# Patient Record
Sex: Female | Born: 1990 | Hispanic: No | Marital: Single | Smoking: Never smoker
Health system: Southern US, Community
[De-identification: ages and names within clinical notes are randomized; demographics above are authoritative.]

## PROBLEM LIST (undated history)

## (undated) DIAGNOSIS — J45909 Unspecified asthma, uncomplicated: Secondary | ICD-10-CM

## (undated) DIAGNOSIS — J3501 Chronic tonsillitis: Secondary | ICD-10-CM

## (undated) DIAGNOSIS — F909 Attention-deficit hyperactivity disorder, unspecified type: Secondary | ICD-10-CM

## (undated) DIAGNOSIS — D509 Iron deficiency anemia, unspecified: Secondary | ICD-10-CM

## (undated) DIAGNOSIS — M549 Dorsalgia, unspecified: Secondary | ICD-10-CM

## (undated) HISTORY — DX: Chronic tonsillitis: J35.01

## (undated) HISTORY — DX: Unspecified asthma, uncomplicated: J45.909

## (undated) HISTORY — DX: Iron deficiency anemia, unspecified: D50.9

## (undated) HISTORY — DX: Dorsalgia, unspecified: M54.9

## (undated) HISTORY — PX: OTHER SURGICAL HISTORY: SHX169

## (undated) HISTORY — DX: Attention-deficit hyperactivity disorder, unspecified type: F90.9

---
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
## 2009-03-26 ENCOUNTER — Encounter: Payer: Self-pay | Admitting: Internal Medicine

## 2009-06-12 ENCOUNTER — Encounter (INDEPENDENT_AMBULATORY_CARE_PROVIDER_SITE_OTHER): Payer: Self-pay | Admitting: *Deleted

## 2009-06-12 ENCOUNTER — Ambulatory Visit: Payer: Self-pay | Admitting: Internal Medicine

## 2009-06-12 DIAGNOSIS — J3501 Chronic tonsillitis: Secondary | ICD-10-CM | POA: Insufficient documentation

## 2009-06-12 DIAGNOSIS — M549 Dorsalgia, unspecified: Secondary | ICD-10-CM

## 2009-06-12 HISTORY — DX: Dorsalgia, unspecified: M54.9

## 2009-06-12 HISTORY — DX: Chronic tonsillitis: J35.01

## 2009-06-16 ENCOUNTER — Telehealth (INDEPENDENT_AMBULATORY_CARE_PROVIDER_SITE_OTHER): Payer: Self-pay | Admitting: *Deleted

## 2009-06-18 ENCOUNTER — Ambulatory Visit: Payer: Self-pay | Admitting: Internal Medicine

## 2009-07-08 ENCOUNTER — Ambulatory Visit (HOSPITAL_COMMUNITY): Admission: RE | Admit: 2009-07-08 | Discharge: 2009-07-08 | Payer: Self-pay | Admitting: Internal Medicine

## 2009-07-08 ENCOUNTER — Ambulatory Visit: Payer: Self-pay

## 2009-07-08 ENCOUNTER — Ambulatory Visit: Payer: Self-pay | Admitting: Cardiology

## 2009-07-10 ENCOUNTER — Encounter (INDEPENDENT_AMBULATORY_CARE_PROVIDER_SITE_OTHER): Payer: Self-pay | Admitting: *Deleted

## 2009-07-11 ENCOUNTER — Telehealth: Payer: Self-pay | Admitting: Internal Medicine

## 2009-07-14 ENCOUNTER — Encounter (INDEPENDENT_AMBULATORY_CARE_PROVIDER_SITE_OTHER): Payer: Self-pay | Admitting: *Deleted

## 2010-04-28 ENCOUNTER — Ambulatory Visit: Admit: 2010-04-28 | Payer: Self-pay | Admitting: Internal Medicine

## 2010-05-03 LAB — CONVERTED CEMR LAB
AST: 20 units/L (ref 0–37)
Albumin: 3.9 g/dL (ref 3.5–5.2)
Alkaline Phosphatase: 57 units/L (ref 39–117)
BUN: 14 mg/dL (ref 6–23)
Basophils Absolute: 0 10*3/uL (ref 0.0–0.1)
Basophils Relative: 0.1 % (ref 0.0–3.0)
CO2: 28 meq/L (ref 19–32)
Calcium: 8.9 mg/dL (ref 8.4–10.5)
Chloride: 108 meq/L (ref 96–112)
Creatinine, Ser: 0.7 mg/dL (ref 0.4–1.2)
Eosinophils Relative: 1.2 % (ref 0.0–5.0)
Folate: 8.8 ng/mL
GFR calc non Af Amer: 138.07 mL/min (ref >60)
HCT: 34.7 % — ABNORMAL LOW (ref 36.0–46.0)
Hemoglobin: 11.6 g/dL — ABNORMAL LOW (ref 12.0–15.0)
Monocytes Absolute: 0.9 10*3/uL (ref 0.1–1.0)
Monocytes Relative: 9.2 % (ref 3.0–12.0)
Neutro Abs: 7.2 10*3/uL (ref 1.4–7.7)
RBC: 3.84 M/uL — ABNORMAL LOW (ref 3.87–5.11)
RDW: 13 % (ref 11.5–14.6)
Sodium: 139 meq/L (ref 135–145)
TSH: 1.53 microintl units/mL (ref 0.35–5.50)
Transferrin: 306.8 mg/dL (ref 212.0–360.0)
WBC: 9.7 10*3/uL (ref 4.5–10.5)

## 2010-05-07 NOTE — Letter (Signed)
Summary: Out of [facility]  [address] [place], [place] [address]   Phone: [phone]  Fax: [phone]    June 12, 2009   Student:  AUGUSTA MIRKIN    To Whom It May Concern:   For Medical reasons, please excuse the above named student from school for the following dates:  Start:   June 10, 2009  End:    June 16, 2009  If you need additional information, please feel free to contact our office.   Sincerely,    Dr. Oliver Barre    ****This is a legal document and cannot be tampered with.  Schools are authorized to verify all information and to do so accordingly.

## 2010-05-07 NOTE — Letter (Signed)
Summary: Generic Letter  [facility]  [address] [place], [place] [address]   Phone: [phone]  Fax: [phone]    07/14/2009  Victoria Wade [address] [place], [place]  [address]  Dear Ms. [name],   Our office has been trying to contact you with the results of the ECHO you had done per your request. Dr.John would like you to know that everything was:   OK except for ? of PFO  - (patent foramen ovale); I asked Dr Graciela Husbands, but he feels no further eval or treatment is needed at this time.  If you have any further questions or concerns please feel free to contact our office at the number listed above. Thank you.          Sincerely,   Margaret Pyle, CMA  Appended Document: Generic Letter pt's mother informed

## 2010-05-07 NOTE — Letter (Signed)
Summary: Work Writer, Main Office  [address]   [place], [place] [address]   Phone: [phone]  Fax: [phone]     July 10, 2009    [facility]   The above named patient had a medical visit on 07/08/09 at: 2:00 pm.  Please take this into consideration when reviewing the time away from school.      Sincerely yours,  Sherri Rad- RN [facility]
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
## 2010-05-07 NOTE — Letter (Signed)
Summary: Out of [facility]  [address] [place], [place] [address]   Phone: [phone]  Fax: [phone]    June 12, 2009   Student:  KHANIYA TENAGLIA    To Whom It May Concern:   For Medical reasons, please excuse the above named student from school for the following dates:  Start:   June 12, 2009  End:     If you need additional information, please feel free to contact our office.   Sincerely,    Dr. Oliver Barre    ****This is a legal document and cannot be tampered with.  Schools are authorized to verify all information and to do so accordingly.

## 2010-05-07 NOTE — Progress Notes (Signed)
----   Converted from flag ---- ---- 06/13/2009 12:43 PM, Edman Circle wrote: appt for echo 3/15@ 7:30 & 3/16 @ 4:00 with Graciela Husbands  ---- 06/13/2009 8:12 AM, Dagoberto Reef wrote: Thanks  ---- 06/13/2009 7:38 AM, Corwin Levins MD wrote: The following orders have been entered for this patient and placed on Admin Hold:  Type:     Referral       Code:   Echo Description:   Echo Referral Order Date:   06/12/2009   Authorized By:   Corwin Levins MD Order #:   [id number] Clinical Notes:   Type:  Special instructions: ------------------------------

## 2010-05-07 NOTE — Progress Notes (Signed)
Summary: ECHO  Phone Note Call from Patient   Caller: Patient [phone] Summary of Call: pt called requesting results of ECHO, please advise. Initial call taken by: Margaret Pyle, CMA,  July 11, 2009 3:32 PM  Follow-up for Phone Call        on phone tree - OK except for ? of PFO  - (patent foramen ovale); I asked Dr Graciela Husbands, but he feels no further eval or treatment is needed at this time Follow-up by: Corwin Levins MD,  July 11, 2009 3:59 PM  Additional Follow-up for Phone Call Additional follow up Details #1::        pt phone number disconnected since 04/08. I will send pt a letter and close phone note Additional Follow-up by: Margaret Pyle, CMA,  July 14, 2009 1:46 PM

## 2010-05-07 NOTE — Assessment & Plan Note (Signed)
Summary: nep/syncope/jml   Visit Type:  Initial Consult  CC:  New EP evaluation- Syncopal episodes.  History of Present Illness: Victoria Wade is seen at the request of Dr. Jonny Ruiz because of recurrent syncope.  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 She does not recall any symptoms prior to about 2 years ago. She is also problems with shower intolerance and heat intolerance. Her symptoms are characterized by lightheadedness a vertiginous sensation diaphoresis some flushing but no nausea. There is residual fatigue.  She also has a shower intolerance. Her diet sounds relatively salt replete and fluid replete.  In the past she is thought to be iron deficient and she was treated with iron supplementation. Recent laboratories were reviewed with a normal MCV and hemoglobin of about 11.5  Current Medications (verified): 1)  Ventolin Hfa 108 (90 Base) Mcg/act Aers (Albuterol Sulfate) .... 2 Puffs As Needed 2)  Azithromycin 250 Mg Tabs (Azithromycin) .... 2po Qd For 1 Day, Then 1po Qd For 4days, Then Stop 3)  Naproxen 500 Mg Tabs (Naproxen) .Marland Kitchen.. 1 By Mouth Two Times A Day As Needed Pain 4)  Excedrin Extra Strength 250-250-65 Mg Tabs (Aspirin-Acetaminophen-Caffeine) .... As Needed 5)  Excedrin Menstrual Complete 250-250-65 Mg Tabs (Aspirin-Acetaminophen-Caffeine) .... As Needed 6)  Womens One Daily  Tabs (Multiple Vitamins-Minerals) .... Take 1 Tablet By Mouth Once A Day 7)  Fish Oil 1000 Mg Caps (Omega-3 Fatty Acids) .... Take 1 Capsule By Mouth Once A Day 8)  Iron 325 (65 Fe) Mg Tabs (Ferrous Sulfate) .... Take 1 Tablet By Mouth Once A Day  Allergies (verified): No Known Drug Allergies  Past History:  Past Medical History: Last updated: 10/30/2008 Asthma ADHD Anemia-iron deficiency  Vital Signs:  Patient profile:   20 year old female Height:      65 inches Weight:       149.75 pounds BMI:     25.01 Pulse (ortho):   77 / minute Pulse rhythm:   regular Resp:     18 per minute BP standing:   104 / 65  Vitals Entered By: Vikki Ports (June 18, 2009 3:37 PM)  Serial Vital Signs/Assessments:  Time      Position  BP       Pulse  Resp  Temp     By 3:44 PM   Lying RA  115/72   94                    Luz Jaramillo 3:46 PM   Sitting   107/65   85                    Luz Jaramillo 3:46 PM   Standing  104/65   77                    Luz Jaramillo 3:48 PM   Standing  111/70   90                    Vikki Ports 3:51 PM   Standing  109/73   96                    Vikki Ports   Physical Exam  General:  Alert and oriented young African American female appearing her stated age in no acute distress. HEENT  normal . Neck veins were  flat; carotids brisk and full without bruits. No lymphadenopathy. Back without kyphosis. Lungs clear. Heart sounds regular without murmurs or gallops. PMI nondisplaced. Abdomen soft with active bowel sounds without midline pulsation or hepatomegaly. Femoral pulses and distal pulses intact. Extremities were without clubbing cyanosis or edemaSkin warm and dry. 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 has symptoms consistent with neurocardiogenic/vasovagal syncope. The state back about 2 years. There is no specific distress at was easily identified although when I raised the issue of stress her mother's head nodded incessantly. The patient gave herself also a knowledge to some things including the fact that her father was not present her senior year.  They're encouraged to address this.  Or Monday May, she is encouraged to increase her salt and water intake. I advised her to raise the head of her bed 6 inches. 2 she is also advised to avoid hot showers and be careful about ambient  temperatures.  laboratories were reviewed demonstrating  normal hemoglobin and TSH The following medications were removed from the medication list:    Prednisone 10 Mg Tabs (Prednisone) .Marland Kitchen... 3po qd for 3days, then 2po qd for 3days, then 1po qd for 3days, then stop

## 2011-02-09 ENCOUNTER — Encounter: Payer: Self-pay | Admitting: Internal Medicine

## 2011-02-09 ENCOUNTER — Telehealth: Payer: Self-pay

## 2011-02-09 NOTE — Telephone Encounter (Signed)
[facility] Triage Call Report Triage Record Num: [id number] Operator: Tomasita Crumble Patient Name: Victoria Wade Call Date & Time: 02/06/2011 4:00:37PM Patient Phone: [phone] PCP: Oliver Barre Patient Gender: Female PCP Fax : [phone] Patient DOB: 1990/05/09 Practice Name: [facility] Reason for Call: Pt. calling. LMP 01/28/11. Pt. calling. Reports headache; FSBG 188. Headache onset "for a couple of weeks now". Very tired, headaches daily dfor approx 5-6 weeks. Concerned that FSGF would be high or elevated and had family member check it. Pain rated at 6/10. Protocol(s) Used: Headache Recommended Outcome per Protocol: See Provider within 24 hours Override Outcome if Used in Protocol: Call Provider When Office is Open RN Reason for Override Outcome: Office Is Closed. Reason for Outcome: Constant headache AND unrelieved with nonprescription medications Care Advice: ~ Do not drink alcoholic beverages or use tobacco products. Most adults need to drink 6-10 eight-ounce glasses (1.2-2.0 liters) of fluids per day unless previously told to limit fluid intake for other medical reasons. Limit fluids that contain caffeine, sugar or alcohol. Urine will be a very light yellow color when you drink enough fluids. ~ Analgesic/Antipyretic Advice - Acetaminophen: Consider acetaminophen as directed on label or by pharmacist/provider for pain or fever PRECAUTIONS: - Use if there is no history of liver disease, alcoholism, or intake of three or more alcohol drinks per day - Only if approved by provider during pregnancy or when breastfeeding - During pregnancy, acetaminophen should not be taken more than 3 consecutive days without telling provider - Do not exceed recommended dose or frequency ~ 02/06/2011 4:12:06PM Page 1 of 1 [id number]_[email or web address]

## 2011-02-09 NOTE — Telephone Encounter (Signed)
To nancy - needs OV, any MD

## 2011-02-11 ENCOUNTER — Other Ambulatory Visit (INDEPENDENT_AMBULATORY_CARE_PROVIDER_SITE_OTHER): Payer: Managed Care, Other (non HMO)

## 2011-02-11 ENCOUNTER — Encounter: Payer: Self-pay | Admitting: Internal Medicine

## 2011-02-11 ENCOUNTER — Ambulatory Visit (INDEPENDENT_AMBULATORY_CARE_PROVIDER_SITE_OTHER): Payer: Managed Care, Other (non HMO) | Admitting: Internal Medicine

## 2011-02-11 VITALS — BP 102/68 | HR 92 | Temp 97.7°F | Ht 65.0 in | Wt 169.2 lb

## 2011-02-11 DIAGNOSIS — Z0001 Encounter for general adult medical examination with abnormal findings: Secondary | ICD-10-CM | POA: Insufficient documentation

## 2011-02-11 DIAGNOSIS — R7302 Impaired glucose tolerance (oral): Secondary | ICD-10-CM

## 2011-02-11 DIAGNOSIS — R7309 Other abnormal glucose: Secondary | ICD-10-CM

## 2011-02-11 DIAGNOSIS — Z0000 Encounter for general adult medical examination without abnormal findings: Secondary | ICD-10-CM

## 2011-02-11 DIAGNOSIS — J45909 Unspecified asthma, uncomplicated: Secondary | ICD-10-CM

## 2011-02-11 DIAGNOSIS — G43909 Migraine, unspecified, not intractable, without status migrainosus: Secondary | ICD-10-CM

## 2011-02-11 LAB — HEPATIC FUNCTION PANEL
ALT: 23 U/L (ref 0–35)
AST: 22 U/L (ref 0–37)
Alkaline Phosphatase: 49 U/L (ref 39–117)
Total Bilirubin: 0.4 mg/dL (ref 0.3–1.2)
Total Protein: 7.2 g/dL (ref 6.0–8.3)

## 2011-02-11 LAB — CBC WITH DIFFERENTIAL/PLATELET
Basophils Absolute: 0 10*3/uL (ref 0.0–0.1)
Basophils Relative: 0.5 % (ref 0.0–3.0)
HCT: 36.5 % (ref 36.0–46.0)
Hemoglobin: 12.6 g/dL (ref 12.0–15.0)
Lymphocytes Relative: 28.2 % (ref 12.0–46.0)
Lymphs Abs: 1.8 10*3/uL (ref 0.7–4.0)
Neutro Abs: 3.9 10*3/uL (ref 1.4–7.7)
RBC: 4.06 Mil/uL (ref 3.87–5.11)

## 2011-02-11 LAB — BASIC METABOLIC PANEL
BUN: 12 mg/dL (ref 6–23)
CO2: 27 mEq/L (ref 19–32)
Calcium: 8.9 mg/dL (ref 8.4–10.5)
Chloride: 106 mEq/L (ref 96–112)
GFR: 156.19 mL/min (ref >60.00)
Glucose, Bld: 88 mg/dL (ref 70–99)
Potassium: 4.1 mEq/L (ref 3.5–5.1)

## 2011-02-11 LAB — URINALYSIS, ROUTINE W REFLEX MICROSCOPIC
Bilirubin Urine: NEGATIVE
Hgb urine dipstick: NEGATIVE
Ketones, ur: NEGATIVE
Urine Glucose: NEGATIVE

## 2011-02-11 LAB — LIPID PANEL
Cholesterol: 140 mg/dL (ref 0–200)
HDL: 41.2 mg/dL (ref >39.00)
Total CHOL/HDL Ratio: 3

## 2011-02-11 LAB — TSH: TSH: 1.49 u[IU]/mL (ref 0.35–5.50)

## 2011-02-11 LAB — HEMOGLOBIN A1C: Hgb A1c MFr Bld: 5.5 % (ref 4.6–6.5)

## 2011-02-11 MED ORDER — ALBUTEROL SULFATE HFA 108 (90 BASE) MCG/ACT IN AERS: 2.0000 | INHALATION_SPRAY | Freq: Four times a day (QID) | RESPIRATORY_TRACT | Status: DC | PRN

## 2011-02-11 NOTE — Patient Instructions (Addendum)
You can take Excedrin Migraine for the headaches as you need Continue all other medications as before - the inhaler Please go to LAB in the Basement for the blood and/or urine tests to be done today Please call the phone number [phone] (the PhoneTree System) for results of testing in 2-3 days;  When calling, simply dial the number, and when prompted enter the MRN number above (the Medical Record Number) and the # key, then the message should start.

## 2011-02-13 ENCOUNTER — Encounter: Payer: Self-pay | Admitting: Internal Medicine

## 2011-02-13 NOTE — Assessment & Plan Note (Signed)

## 2011-02-13 NOTE — Assessment & Plan Note (Signed)
stable overall by hx and exam, most recent data reviewed with pt, and pt to continue medical treatment as before 

## 2011-02-13 NOTE — Assessment & Plan Note (Signed)
stable overall by hx and exam, and pt to continue medical treatment as before 

## 2011-02-13 NOTE — Progress Notes (Signed)
Subjective:    Patient ID: Victoria Wade, female    DOB: 04-23-1990, 20 y.o.   MRN: [id number]  HPI Here for wellness and f/u;  Overall doing ok;  Pt denies CP, worsening SOB, DOE, wheezing, orthopnea, PND, worsening LE edema, palpitations, dizziness or syncope.  Pt denies neurological change such as new Headache, facial or extremity weakness.  Pt denies polydipsia, polyuria, or low sugar symptoms. Pt states overall good compliance with treatment and medications, good tolerability, and trying to follow lower cholesterol diet.  Pt denies worsening depressive symptoms, suicidal ideation or panic. No fever, wt loss, night sweats, loss of appetite, or other constitutional symptoms.  Pt states good ability with ADL's, low fall risk, home safety reviewed and adequate, no significant changes in hearing or vision, and occasionally active with exercise.  Has occasional migraine but none worening in freq or severity lately. Did have a blood sugar per uncles glucometer at 188 - ? Error Past Medical History  Diagnosis Date  . ADHD 10/30/2008  . ANEMIA-IRON DEFICIENCY 10/30/2008  . ASTHMA 10/30/2008  . BACK PAIN 06/12/2009  . TONSILLITIS, CHRONIC 06/12/2009   Past Surgical History  Procedure Date  . None     reports that she has never smoked. She does not have any smokeless tobacco history on file. She reports that she does not drink alcohol or use illicit drugs. family history includes Cancer in her other; Diabetes in her other; Hypertension in her other; Prostate cancer in her maternal uncle; and Stroke in her other. No Known Allergies No current outpatient prescriptions on file prior to visit.     Review of Systems Review of Systems  Constitutional: Negative for diaphoresis, activity change, appetite change and unexpected weight change.  HENT: Negative for hearing loss, ear pain, facial swelling, mouth sores and neck stiffness.   Eyes: Negative for pain, redness and visual disturbance.    Respiratory: Negative for shortness of breath and wheezing.   Cardiovascular: Negative for chest pain and palpitations.  Gastrointestinal: Negative for diarrhea, blood in stool, abdominal distention and rectal pain.  Genitourinary: Negative for hematuria, flank pain and decreased urine volume.  Musculoskeletal: Negative for myalgias and joint swelling.  Skin: Negative for color change and wound.  Neurological: Negative for syncope and numbness.  Hematological: Negative for adenopathy.  Psychiatric/Behavioral: Negative for hallucinations, self-injury, decreased concentration and agitation.      Objective:   Physical Exam BP 102/68  Pulse 92  Temp(Src) 97.7 F (36.5 C) (Oral)  Ht 5\' 5"  (1.651 m)  Wt 169 lb 4 oz (76.771 kg)  BMI 28.16 kg/m2  SpO2 98%  LMP 01/27/2011 Physical Exam  VS noted Constitutional: Pt is oriented to person, place, and time. Appears well-developed and well-nourished.  HENT:  Head: Normocephalic and atraumatic.  Right Ear: External ear normal.  Left Ear: External ear normal.  Nose: Nose normal.  Mouth/Throat: Oropharynx is clear and moist.  Eyes: Conjunctivae and EOM are normal. Pupils are equal, round, and reactive to light.  Neck: Normal range of motion. Neck supple. No JVD present. No tracheal deviation present.  Cardiovascular: Normal rate, regular rhythm, normal heart sounds and intact distal pulses.   Pulmonary/Chest: Effort normal and breath sounds normal.  Abdominal: Soft. Bowel sounds are normal. There is no tenderness.  Musculoskeletal: Normal range of motion. Exhibits no edema.  Lymphadenopathy:  Has no cervical adenopathy.  Neurological: Pt is alert and oriented to person, place, and time. Pt has normal reflexes. No cranial nerve deficit.  Skin: Skin  is warm and dry. No rash noted.  Psychiatric:  Has  normal mood and affect. Behavior is normal.         Assessment & Plan:

## 2011-02-13 NOTE — Assessment & Plan Note (Signed)
?   Glucometer error, to check glc/a1c today

## 2012-12-05 ENCOUNTER — Telehealth: Payer: Self-pay

## 2012-12-05 DIAGNOSIS — Z0000 Encounter for general adult medical examination without abnormal findings: Secondary | ICD-10-CM

## 2012-12-05 NOTE — Telephone Encounter (Signed)
cpx labs entered  

## 2013-01-29 ENCOUNTER — Other Ambulatory Visit (INDEPENDENT_AMBULATORY_CARE_PROVIDER_SITE_OTHER): Payer: Managed Care, Other (non HMO)

## 2013-01-29 DIAGNOSIS — Z0000 Encounter for general adult medical examination without abnormal findings: Secondary | ICD-10-CM

## 2013-01-29 LAB — LIPID PANEL
LDL Cholesterol: 92 mg/dL (ref 0–99)
Total CHOL/HDL Ratio: 3
Triglycerides: 53 mg/dL (ref 0.0–149.0)
VLDL: 10.6 mg/dL (ref 0.0–40.0)

## 2013-01-29 LAB — HEPATIC FUNCTION PANEL
ALT: 24 U/L (ref 0–35)
Bilirubin, Direct: 0.1 mg/dL (ref 0.0–0.3)

## 2013-01-29 LAB — URINALYSIS, ROUTINE W REFLEX MICROSCOPIC
Bilirubin Urine: NEGATIVE
Ketones, ur: NEGATIVE
Total Protein, Urine: NEGATIVE
Urobilinogen, UA: 0.2 (ref 0.0–1.0)
pH: 6 (ref 5.0–8.0)

## 2013-01-29 LAB — BASIC METABOLIC PANEL
BUN: 12 mg/dL (ref 6–23)
Chloride: 103 mEq/L (ref 96–112)
Creatinine, Ser: 0.8 mg/dL (ref 0.4–1.2)
GFR: 119.45 mL/min (ref >60.00)
Potassium: 4.1 mEq/L (ref 3.5–5.1)

## 2013-01-29 LAB — CBC WITH DIFFERENTIAL/PLATELET
Basophils Relative: 0.2 % (ref 0.0–3.0)
Eosinophils Absolute: 0.3 10*3/uL (ref 0.0–0.7)
HCT: 37.9 % (ref 36.0–46.0)
Lymphocytes Relative: 26 % (ref 12.0–46.0)
Lymphs Abs: 1.3 10*3/uL (ref 0.7–4.0)
Neutro Abs: 3.2 10*3/uL (ref 1.4–7.7)
Neutrophils Relative %: 61.7 % (ref 43.0–77.0)
RBC: 4.34 Mil/uL (ref 3.87–5.11)
WBC: 5.2 10*3/uL (ref 4.5–10.5)

## 2013-01-29 LAB — TSH: TSH: 1.2 u[IU]/mL (ref 0.35–5.50)

## 2013-01-30 ENCOUNTER — Encounter: Payer: Self-pay | Admitting: Internal Medicine

## 2013-01-30 ENCOUNTER — Ambulatory Visit (INDEPENDENT_AMBULATORY_CARE_PROVIDER_SITE_OTHER): Payer: Managed Care, Other (non HMO) | Admitting: Internal Medicine

## 2013-01-30 VITALS — BP 110/68 | HR 91 | Temp 99.0°F | Ht 65.0 in | Wt 171.8 lb

## 2013-01-30 DIAGNOSIS — J4530 Mild persistent asthma, uncomplicated: Secondary | ICD-10-CM

## 2013-01-30 DIAGNOSIS — Z23 Encounter for immunization: Secondary | ICD-10-CM

## 2013-01-30 DIAGNOSIS — Z0000 Encounter for general adult medical examination without abnormal findings: Secondary | ICD-10-CM

## 2013-01-30 DIAGNOSIS — J45909 Unspecified asthma, uncomplicated: Secondary | ICD-10-CM

## 2013-01-30 MED ORDER — BUDESONIDE-FORMOTEROL FUMARATE 160-4.5 MCG/ACT IN AERO: 2.0000 | INHALATION_SPRAY | Freq: Two times a day (BID) | RESPIRATORY_TRACT | Status: DC

## 2013-01-30 MED ORDER — ALBUTEROL SULFATE HFA 108 (90 BASE) MCG/ACT IN AERS: 2.0000 | INHALATION_SPRAY | Freq: Four times a day (QID) | RESPIRATORY_TRACT | Status: DC | PRN

## 2013-01-30 NOTE — Assessment & Plan Note (Signed)

## 2013-01-30 NOTE — Patient Instructions (Signed)
Please take all new medication as prescribed - the symbicort 160 is two puffs twice per day  Please remember to rinse your mouth with water after completing each use, as it can cause thrush to the mouth  Please continue all other medications as before, and refills have been done if requested.  Please have the pharmacy call with any other refills you may need.  Please continue your efforts at being more active, low cholesterol diet, and weight control.  You are otherwise up to date with prevention measures today.  Please keep your appointments with your specialists as you have planned - GYN  Please return in 1 year for your yearly visit, or sooner if needed, with Lab testing done 3-5 days before

## 2013-01-30 NOTE — Addendum Note (Signed)
Addended by: Scharlene Gloss B on: 01/30/2013 02:01 PM   Modules accepted: Orders

## 2013-01-30 NOTE — Assessment & Plan Note (Signed)
To add symbicort asd,  to f/u any worsening symptoms or concerns  
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
## 2013-08-29 ENCOUNTER — Encounter: Payer: Self-pay | Admitting: Internal Medicine

## 2014-01-31 ENCOUNTER — Encounter: Payer: Managed Care, Other (non HMO) | Admitting: Internal Medicine

## 2014-02-22 ENCOUNTER — Encounter: Payer: Managed Care, Other (non HMO) | Admitting: Internal Medicine

## 2014-03-15 ENCOUNTER — Ambulatory Visit (INDEPENDENT_AMBULATORY_CARE_PROVIDER_SITE_OTHER): Payer: Managed Care, Other (non HMO) | Admitting: Internal Medicine

## 2014-03-15 ENCOUNTER — Encounter: Payer: Self-pay | Admitting: Internal Medicine

## 2014-03-15 ENCOUNTER — Other Ambulatory Visit (INDEPENDENT_AMBULATORY_CARE_PROVIDER_SITE_OTHER): Payer: Managed Care, Other (non HMO)

## 2014-03-15 VITALS — BP 112/82 | HR 75 | Temp 98.3°F | Ht 65.0 in | Wt 172.5 lb

## 2014-03-15 DIAGNOSIS — Z0000 Encounter for general adult medical examination without abnormal findings: Secondary | ICD-10-CM

## 2014-03-15 DIAGNOSIS — Z0189 Encounter for other specified special examinations: Secondary | ICD-10-CM

## 2014-03-15 DIAGNOSIS — M549 Dorsalgia, unspecified: Secondary | ICD-10-CM

## 2014-03-15 DIAGNOSIS — Z23 Encounter for immunization: Secondary | ICD-10-CM

## 2014-03-15 DIAGNOSIS — M546 Pain in thoracic spine: Secondary | ICD-10-CM

## 2014-03-15 DIAGNOSIS — N62 Hypertrophy of breast: Secondary | ICD-10-CM

## 2014-03-15 LAB — URINALYSIS, ROUTINE W REFLEX MICROSCOPIC
Bilirubin Urine: NEGATIVE
Hgb urine dipstick: NEGATIVE
Ketones, ur: NEGATIVE
Nitrite: NEGATIVE
Specific Gravity, Urine: 1.01 (ref 1.000–1.030)
TOTAL PROTEIN, URINE-UPE24: NEGATIVE
URINE GLUCOSE: NEGATIVE
Urobilinogen, UA: 0.2 (ref 0.0–1.0)
pH: 6.5 (ref 5.0–8.0)

## 2014-03-15 LAB — CBC WITH DIFFERENTIAL/PLATELET
BASOS PCT: 0.5 % (ref 0.0–3.0)
Basophils Absolute: 0 10*3/uL (ref 0.0–0.1)
EOS ABS: 0.1 10*3/uL (ref 0.0–0.7)
Eosinophils Relative: 1 % (ref 0.0–5.0)
HCT: 40.2 % (ref 36.0–46.0)
HEMOGLOBIN: 13.4 g/dL (ref 12.0–15.0)
Lymphocytes Relative: 32.5 % (ref 12.0–46.0)
Lymphs Abs: 2.2 10*3/uL (ref 0.7–4.0)
MCHC: 33.2 g/dL (ref 30.0–36.0)
MCV: 88 fl (ref 78.0–100.0)
Monocytes Absolute: 0.4 10*3/uL (ref 0.1–1.0)
Monocytes Relative: 6.6 % (ref 3.0–12.0)
NEUTROS ABS: 4 10*3/uL (ref 1.4–7.7)
Neutrophils Relative %: 59.4 % (ref 43.0–77.0)
Platelets: 275 10*3/uL (ref 150.0–400.0)
RBC: 4.57 Mil/uL (ref 3.87–5.11)
RDW: 13.7 % (ref 11.5–15.5)
WBC: 6.8 10*3/uL (ref 4.0–10.5)

## 2014-03-15 LAB — LIPID PANEL
CHOL/HDL RATIO: 4
Cholesterol: 171 mg/dL (ref 0–200)
HDL: 46.2 mg/dL (ref >39.00)
LDL Cholesterol: 113 mg/dL — ABNORMAL HIGH (ref 0–99)
NonHDL: 124.8
TRIGLYCERIDES: 57 mg/dL (ref 0.0–149.0)
VLDL: 11.4 mg/dL (ref 0.0–40.0)

## 2014-03-15 LAB — HEPATIC FUNCTION PANEL
ALT: 23 U/L (ref 0–35)
AST: 26 U/L (ref 0–37)
Albumin: 4 g/dL (ref 3.5–5.2)
Alkaline Phosphatase: 52 U/L (ref 39–117)
BILIRUBIN DIRECT: 0 mg/dL (ref 0.0–0.3)
BILIRUBIN TOTAL: 0.7 mg/dL (ref 0.2–1.2)
Total Protein: 7.6 g/dL (ref 6.0–8.3)

## 2014-03-15 LAB — BASIC METABOLIC PANEL
BUN: 12 mg/dL (ref 6–23)
CALCIUM: 9.1 mg/dL (ref 8.4–10.5)
CO2: 24 mEq/L (ref 19–32)
Chloride: 103 mEq/L (ref 96–112)
Creatinine, Ser: 0.8 mg/dL (ref 0.4–1.2)
GFR: 121.95 mL/min (ref >60.00)
Glucose, Bld: 87 mg/dL (ref 70–99)
Potassium: 3.9 mEq/L (ref 3.5–5.1)
Sodium: 133 mEq/L — ABNORMAL LOW (ref 135–145)

## 2014-03-15 LAB — TSH: TSH: 1.98 u[IU]/mL (ref 0.35–4.50)

## 2014-03-15 NOTE — Progress Notes (Signed)
Pre visit review using our clinic review tool, if applicable. No additional management support is needed unless otherwise documented below in the visit note. 

## 2014-03-15 NOTE — Assessment & Plan Note (Signed)
With upper back pain - for plastic surgury referral

## 2014-03-15 NOTE — Assessment & Plan Note (Signed)

## 2014-03-15 NOTE — Patient Instructions (Addendum)
You had the flu shot today  You will be contacted regarding the referral for: plastic surgury  Please continue all other medications as before, and refills have been done if requested.  Please have the pharmacy call with any other refills you may need.  Please continue your efforts at being more active, low cholesterol diet, and weight control.  You are otherwise up to date with prevention measures today.  Please keep your appointments with your specialists as you may have planned  Please go to the LAB in the Basement (turn left off the elevator) for the tests to be done today  You will be contacted by phone if any changes need to be made immediately.  Otherwise, you will receive a letter about your results with an explanation, but please check with MyChart first.  Please remember to sign up for MyChart if you have not done so, as this will be important to you in the future with finding out test results, communicating by private email, and scheduling acute appointments online when needed.  Please return in 1 year for your yearly visit, or sooner if needed

## 2014-03-15 NOTE — Progress Notes (Signed)
Subjective:    Patient ID: Victoria Wade, female    DOB: 1990/08/15, 23 y.o.   MRN: [id number]  HPI  Here for wellness and f/u;  Overall doing ok;  Pt denies CP, worsening SOB, DOE, wheezing, orthopnea, PND, worsening LE edema, palpitations, dizziness or syncope.  Pt denies neurological change such as new headache, facial or extremity weakness.  Pt denies polydipsia, polyuria, or low sugar symptoms. Pt states overall good compliance with treatment and medications, good tolerability, and has been trying to follow lower cholesterol diet.  Pt denies worsening depressive symptoms, suicidal ideation or panic. No fever, night sweats, wt loss, loss of appetite, or other constitutional symptoms.  Pt states good ability with ADL's, has low fall risk, home safety reviewed and adequate, no other significant changes in hearing or vision, and only occasionally active with exercise.  Has larger breasts oin last few yrs, now with straps digging in though has been fitted correctly, now with ongoing daily upepr back pain.  Saw GYN, suggested ? Posture related but no better.  Past Medical History  Diagnosis Date  . ADHD 10/30/2008  . ANEMIA-IRON DEFICIENCY 10/30/2008  . ASTHMA 10/30/2008  . BACK PAIN 06/12/2009  . TONSILLITIS, CHRONIC 06/12/2009   Past Surgical History  Procedure Laterality Date  . None      reports that she has never smoked. She does not have any smokeless tobacco history on file. She reports that she does not drink alcohol or use illicit drugs. family history includes Cancer in her other; Diabetes in her other; Hypertension in her other; Prostate cancer in her maternal uncle; Stroke in her other. No Known Allergies Current Outpatient Prescriptions on File Prior to Visit  Medication Sig Dispense Refill  . norethindrone-ethinyl estradiol (JUNEL FE,GILDESS FE,LOESTRIN FE) 1-20 MG-MCG tablet Take 1 tablet by mouth daily.     No current facility-administered medications on file prior to visit.    Review of Systems Constitutional: Negative for increased diaphoresis, other activity, appetite or other siginficant weight change  HENT: Negative for worsening hearing loss, ear pain, facial swelling, mouth sores and neck stiffness.   Eyes: Negative for other worsening pain, redness or visual disturbance.  Respiratory: Negative for shortness of breath and wheezing.   Cardiovascular: Negative for chest pain and palpitations.  Gastrointestinal: Negative for diarrhea, blood in stool, abdominal distention or other pain Genitourinary: Negative for hematuria, flank pain or change in urine volume.  Musculoskeletal: Negative for myalgias or other joint complaints.  Skin: Negative for color change and wound.  Neurological: Negative for syncope and numbness. other than noted Hematological: Negative for adenopathy. or other swelling Psychiatric/Behavioral: Negative for hallucinations, self-injury, decreased concentration or other worsening agitation.      Objective:   Physical Exam BP 112/82 mmHg  Pulse 75  Temp(Src) 98.3 F (36.8 C) (Oral)  Ht 5\' 5"  (1.651 m)  Wt 172 lb 8 oz (78.245 kg)  BMI 28.71 kg/m2  SpO2 96% VS noted, not ill appearing Constitutional: Pt is oriented to person, place, and time. Appears well-developed and well-nourished.  Head: Normocephalic and atraumatic.  Right Ear: External ear normal.  Left Ear: External ear normal.  Nose: Nose normal.  Mouth/Throat: Oropharynx is clear and moist.  Eyes: Conjunctivae and EOM are normal. Pupils are equal, round, and reactive to light.  Neck: Normal range of motion. Neck supple. No JVD present. No tracheal deviation present.  Cardiovascular: Normal rate, regular rhythm, normal heart sounds and intact distal pulses.   Pulmonary/Chest: Effort normal and  breath sounds without rales or wheezing  Abdominal: Soft. Bowel sounds are normal. NT. No HSM  Musculoskeletal: Normal range of motion. Exhibits no edema.  Breasts: not examiined  except to note large pendulous Lymphadenopathy:  Has no cervical adenopathy.  Neurological: Pt is alert and oriented to person, place, and time. Pt has normal reflexes. No cranial nerve deficit. Motor grossly intact Skin: Skin is warm and dry. No rash noted.  Psychiatric:  Has normal mood and affect. Behavior is normal.     Assessment & Plan:

## 2014-11-08 ENCOUNTER — Encounter: Payer: Self-pay | Admitting: Internal Medicine

## 2014-11-08 ENCOUNTER — Ambulatory Visit (INDEPENDENT_AMBULATORY_CARE_PROVIDER_SITE_OTHER): Payer: Managed Care, Other (non HMO) | Admitting: Internal Medicine

## 2014-11-08 VITALS — BP 108/72 | HR 67 | Temp 98.2°F | Ht 65.0 in | Wt 179.0 lb

## 2014-11-08 DIAGNOSIS — G43809 Other migraine, not intractable, without status migrainosus: Secondary | ICD-10-CM | POA: Diagnosis not present

## 2014-11-08 DIAGNOSIS — N920 Excessive and frequent menstruation with regular cycle: Secondary | ICD-10-CM | POA: Diagnosis not present

## 2014-11-08 DIAGNOSIS — J4530 Mild persistent asthma, uncomplicated: Secondary | ICD-10-CM

## 2014-11-08 MED ORDER — NAPROXEN 500 MG PO TABS: 500.0000 mg | ORAL_TABLET | Freq: Two times a day (BID) | ORAL | Status: DC

## 2014-11-08 MED ORDER — SUMATRIPTAN SUCCINATE 100 MG PO TABS: 100.0000 mg | ORAL_TABLET | ORAL | Status: DC | PRN

## 2014-11-08 NOTE — Progress Notes (Signed)
Pre visit review using our clinic review tool, if applicable. No additional management support is needed unless otherwise documented below in the visit note. 

## 2014-11-08 NOTE — Patient Instructions (Signed)
Please take all new medication as prescribed - the anti-inflammatory, and imitrex for migraines as needed  Please continue all other medications as before, and refills have been done if requested.  Please have the pharmacy call with any other refills you may need.  Please keep your appointments with your specialists as you may have planned - GYN next month

## 2014-11-08 NOTE — Progress Notes (Signed)
   Subjective:    Patient ID: Victoria Wade, female    DOB: 06-28-1990, 24 y.o.   MRN: [id number]  HPI  Here to f/u, seen at UC recently with post throbbing HA, severe, with nausea, no blurred vision/photophobia/vomiting or fever, no ST, sinus congestion, cough, and Pt denies chest pain, increased sob or doe, wheezing, orthopnea, PND, increased LE swelling, palpitations, dizziness or syncope. Has been increased in frequency almost daily a 2 wks since stopped her BCP's on her own. Has appt with GYN next week, cant really say why but after over 1 yr she came to not like her BCP and will d/w GYN regarding change.  Pt denies new neurological symptoms such as facial or extremity weakness or numbness Past Medical History  Diagnosis Date  . ADHD 10/30/2008  . ANEMIA-IRON DEFICIENCY 10/30/2008  . ASTHMA 10/30/2008  . BACK PAIN 06/12/2009  . TONSILLITIS, CHRONIC 06/12/2009   Past Surgical History  Procedure Laterality Date  . None      reports that she has never smoked. She does not have any smokeless tobacco history on file. She reports that she does not drink alcohol or use illicit drugs. family history includes Cancer in her other; Diabetes in her other; Hypertension in her other; Prostate cancer in her maternal uncle; Stroke in her other. No Known Allergies No current outpatient prescriptions on file prior to visit.   No current facility-administered medications on file prior to visit.   Review of Systems  Constitutional: Negative for unusual diaphoresis or night sweats HENT: Negative for ringing in ear or discharge Eyes: Negative for double vision or worsening visual disturbance.  Respiratory: Negative for choking and stridor.   Gastrointestinal: Negative for vomiting or other signifcant bowel change Genitourinary: Negative for hematuria or change in urine volume.  Musculoskeletal: Negative for other MSK pain or swelling Skin: Negative for color change and worsening wound.  Neurological:  Negative for tremors and numbness other than noted  Psychiatric/Behavioral: Negative for decreased concentration or agitation other than above       Objective:   Physical Exam BP 108/72 mmHg  Pulse 67  Temp(Src) 98.2 F (36.8 C) (Oral)  Ht  (1.651 m)  Wt 179 lb (81.194 kg)  BMI 29.79 kg/m2  SpO2 97%  LMP 10/26/2014 VS noted, not ill appearing Constitutional: Pt appears in no significant distress HENT: Head: NCAT.  Right Ear: External ear normal.  Left Ear: External ear normal.  Eyes: . Pupils are equal, round, and reactive to light. Conjunctivae and EOM are normal Neck: Normal range of motion. Neck supple.  Cardiovascular: Normal rate and regular rhythm.   Pulmonary/Chest: Effort normal and breath sounds without rales or wheezing.  Neurological: Pt is alert. Not confused , motor 5/5 intact, sens intact, dtr symmetric, gait intact Skin: Skin is warm. No rash, no LE edema Psychiatric: Pt behavior is normal. No agitation.     Assessment & Plan:

## 2014-11-09 NOTE — Assessment & Plan Note (Signed)
stable overall by history and exam, recent data reviewed with pt, and pt to continue medical treatment as before,  to f/u any worsening symptoms or concerns SpO2 Readings from Last 3 Encounters:  11/08/14 97%  03/15/14 96%  01/30/13 98%

## 2014-11-09 NOTE — Assessment & Plan Note (Signed)
Improved with BCP for over 1 yr but now off bcp, may need restart, to f/u gyn as planned

## 2014-11-09 NOTE — Assessment & Plan Note (Signed)
Worsening since stopped BCP on her own, possibly hormonal related, for imitrex prn with alleve prn

## 2015-03-11 ENCOUNTER — Encounter: Payer: Self-pay | Admitting: Internal Medicine

## 2015-03-11 ENCOUNTER — Other Ambulatory Visit (INDEPENDENT_AMBULATORY_CARE_PROVIDER_SITE_OTHER): Payer: Managed Care, Other (non HMO)

## 2015-03-11 ENCOUNTER — Ambulatory Visit (INDEPENDENT_AMBULATORY_CARE_PROVIDER_SITE_OTHER): Payer: Managed Care, Other (non HMO) | Admitting: Internal Medicine

## 2015-03-11 VITALS — BP 110/70 | HR 84 | Temp 98.0°F | Ht 65.0 in | Wt 191.4 lb

## 2015-03-11 DIAGNOSIS — Z0000 Encounter for general adult medical examination without abnormal findings: Secondary | ICD-10-CM | POA: Diagnosis not present

## 2015-03-11 DIAGNOSIS — R7302 Impaired glucose tolerance (oral): Secondary | ICD-10-CM

## 2015-03-11 LAB — URINALYSIS, ROUTINE W REFLEX MICROSCOPIC
BILIRUBIN URINE: NEGATIVE
Hgb urine dipstick: NEGATIVE
Ketones, ur: NEGATIVE
Leukocytes, UA: NEGATIVE
Nitrite: NEGATIVE
PH: 7.5 (ref 5.0–8.0)
Specific Gravity, Urine: 1.02 (ref 1.000–1.030)
UROBILINOGEN UA: 0.2 (ref 0.0–1.0)
Urine Glucose: NEGATIVE

## 2015-03-11 LAB — HEPATIC FUNCTION PANEL
ALBUMIN: 4 g/dL (ref 3.5–5.2)
ALK PHOS: 45 U/L (ref 39–117)
ALT: 18 U/L (ref 0–35)
AST: 20 U/L (ref 0–37)
Bilirubin, Direct: 0 mg/dL (ref 0.0–0.3)
TOTAL PROTEIN: 7.5 g/dL (ref 6.0–8.3)
Total Bilirubin: 0.3 mg/dL (ref 0.2–1.2)

## 2015-03-11 LAB — LIPID PANEL
CHOLESTEROL: 145 mg/dL (ref 0–200)
HDL: 40.6 mg/dL (ref >39.00)
LDL CALC: 89 mg/dL (ref 0–99)
NonHDL: 103.98
Total CHOL/HDL Ratio: 4
Triglycerides: 76 mg/dL (ref 0.0–149.0)
VLDL: 15.2 mg/dL (ref 0.0–40.0)

## 2015-03-11 LAB — BASIC METABOLIC PANEL
BUN: 12 mg/dL (ref 6–23)
CO2: 28 meq/L (ref 19–32)
Calcium: 9.4 mg/dL (ref 8.4–10.5)
Chloride: 105 mEq/L (ref 96–112)
Creatinine, Ser: 0.94 mg/dL (ref 0.40–1.20)
GFR: 93.22 mL/min (ref >60.00)
GLUCOSE: 100 mg/dL — AB (ref 70–99)
POTASSIUM: 4.3 meq/L (ref 3.5–5.1)
Sodium: 139 mEq/L (ref 135–145)

## 2015-03-11 LAB — CBC WITH DIFFERENTIAL/PLATELET
BASOS ABS: 0 10*3/uL (ref 0.0–0.1)
BASOS PCT: 0.3 % (ref 0.0–3.0)
EOS ABS: 0.1 10*3/uL (ref 0.0–0.7)
Eosinophils Relative: 1.5 % (ref 0.0–5.0)
HEMATOCRIT: 38.7 % (ref 36.0–46.0)
Hemoglobin: 12.8 g/dL (ref 12.0–15.0)
LYMPHS ABS: 2.1 10*3/uL (ref 0.7–4.0)
LYMPHS PCT: 29.2 % (ref 12.0–46.0)
MCHC: 33.2 g/dL (ref 30.0–36.0)
MCV: 87.1 fl (ref 78.0–100.0)
MONO ABS: 0.7 10*3/uL (ref 0.1–1.0)
Monocytes Relative: 10.2 % (ref 3.0–12.0)
NEUTROS ABS: 4.1 10*3/uL (ref 1.4–7.7)
NEUTROS PCT: 58.8 % (ref 43.0–77.0)
PLATELETS: 258 10*3/uL (ref 150.0–400.0)
RBC: 4.44 Mil/uL (ref 3.87–5.11)
RDW: 13.4 % (ref 11.5–15.5)
WBC: 7.1 10*3/uL (ref 4.0–10.5)

## 2015-03-11 LAB — HEMOGLOBIN A1C: HEMOGLOBIN A1C: 5.5 % (ref 4.6–6.5)

## 2015-03-11 LAB — TSH: TSH: 1.76 u[IU]/mL (ref 0.35–4.50)

## 2015-03-11 NOTE — Assessment & Plan Note (Signed)
stable overall by history and exam, recent data reviewed with pt, and pt to continue medical treatment as before,  to f/u any worsening symptoms or concerns Lab Results  Component Value Date   HGBA1C 5.5 02/11/2011   For f/u lab, consider metformin start, also for wt loss, exercise and reduce calories, and refer DM education

## 2015-03-11 NOTE — Progress Notes (Signed)
Subjective:    Patient ID: Victoria Wade, female    DOB: 1990/08/17, 24 y.o.   MRN: [id number]  HPI  Here for wellness and f/u;  Overall doing ok;  Pt denies Chest pain, worsening SOB, DOE, wheezing, orthopnea, PND, worsening LE edema, palpitations, dizziness or syncope.  Pt denies neurological change such as new headache, facial or extremity weakness.  Pt denies polydipsia, polyuria, or low sugar symptoms. Pt states overall good compliance with treatment and medications, good tolerability, and has been trying to follow appropriate diet.  Pt denies worsening depressive symptoms, suicidal ideation or panic. No fever, night sweats, wt loss, loss of appetite, or other constitutional symptoms.  Pt states good ability with ADL's, has low fall risk, home safety reviewed and adequate, no other significant changes in hearing or vision, and only occasionally active with exercise. Did have recent elev A1c per GYN - 5.9 (normal up to 5.6 by that lab) Past Medical History  Diagnosis Date  . ADHD 10/30/2008  . ANEMIA-IRON DEFICIENCY 10/30/2008  . ASTHMA 10/30/2008  . BACK PAIN 06/12/2009  . TONSILLITIS, CHRONIC 06/12/2009   Past Surgical History  Procedure Laterality Date  . None      reports that she has never smoked. She does not have any smokeless tobacco history on file. She reports that she does not drink alcohol or use illicit drugs. family history includes Cancer in her other; Diabetes in her other; Hypertension in her other; Prostate cancer in her maternal uncle; Stroke in her other. No Known Allergies Current Outpatient Prescriptions on File Prior to Visit  Medication Sig Dispense Refill  . naproxen (NAPROSYN) 500 MG tablet Take 1 tablet (500 mg total) by mouth 2 (two) times daily with a meal. 60 tablet 2  . SUMAtriptan (IMITREX) 100 MG tablet Take 1 tablet (100 mg total) by mouth every 2 (two) hours as needed for migraine. May repeat in 2 hours if headache persists or recurs. 10 tablet 3    No current facility-administered medications on file prior to visit.   Review of Systems Constitutional: Negative for increased diaphoresis, other activity, appetite or siginficant weight change other than noted HENT: Negative for worsening hearing loss, ear pain, facial swelling, mouth sores and neck stiffness.   Eyes: Negative for other worsening pain, redness or visual disturbance.  Respiratory: Negative for shortness of breath and wheezing  Cardiovascular: Negative for chest pain and palpitations.  Gastrointestinal: Negative for diarrhea, blood in stool, abdominal distention or other pain Genitourinary: Negative for hematuria, flank pain or change in urine volume.  Musculoskeletal: Negative for myalgias or other joint complaints.  Skin: Negative for color change and wound or drainage.  Neurological: Negative for syncope and numbness. other than noted Hematological: Negative for adenopathy. or other swelling Psychiatric/Behavioral: Negative for hallucinations, SI, self-injury, decreased concentration or other worsening agitation.      Objective:   Physical Exam BP 110/70 mmHg  Pulse 84  Temp(Src) 98 F (36.7 C) (Oral)  Ht  (1.651 m)  Wt 191 lb 6 oz (86.807 kg)  BMI 31.85 kg/m2  SpO2 95%  LMP 03/11/2015 VS noted, obese Constitutional: Pt is oriented to person, place, and time. Appears well-developed and well-nourished, in no significant distress Head: Normocephalic and atraumatic.  Right Ear: External ear normal.  Left Ear: External ear normal.  Nose: Nose normal.  Mouth/Throat: Oropharynx is clear and moist.  Eyes: Conjunctivae and EOM are normal. Pupils are equal, round, and reactive to light.  Neck: Normal range of  motion. Neck supple. No JVD present. No tracheal deviation present or significant neck LA or mass Cardiovascular: Normal rate, regular rhythm, normal heart sounds and intact distal pulses.   Pulmonary/Chest: Effort normal and breath sounds without rales  or wheezing  Abdominal: Soft. Bowel sounds are normal. NT. No HSM  Musculoskeletal: Normal range of motion. Exhibits no edema.  Lymphadenopathy:  Has no cervical adenopathy.  Neurological: Pt is alert and oriented to person, place, and time. Pt has normal reflexes. No cranial nerve deficit. Motor grossly intact Skin: Skin is warm and dry. No rash noted.  Psychiatric:  Has normal mood and affect. Behavior is normal.     Assessment & Plan:

## 2015-03-11 NOTE — Assessment & Plan Note (Signed)

## 2015-03-11 NOTE — Progress Notes (Signed)
Pre visit review using our clinic review tool, if applicable. No additional management support is needed unless otherwise documented below in the visit note. 

## 2015-03-11 NOTE — Patient Instructions (Addendum)
Please continue all other medications as before, and refills have been done if requested.  Please have the pharmacy call with any other refills you may need.  Please continue your efforts at being more active, low cholesterol diet, and weight control.  You are otherwise up to date with prevention measures today.  You will be contacted regarding the referral for: Diabetes Education  Please keep your appointments with your specialists as you may have planned  Please go to the LAB in the Basement (turn left off the elevator) for the tests to be done today  You will be contacted by phone if any changes need to be made immediately.  Otherwise, you will receive a letter about your results with an explanation, but please check with MyChart first.  Please remember to sign up for MyChart if you have not done so, as this will be important to you in the future with finding out test results, communicating by private email, and scheduling acute appointments online when needed.  Please return in 6 months, or sooner if needed, with Lab testing done 3-5 days before

## 2015-03-12 ENCOUNTER — Encounter: Payer: Self-pay | Admitting: Internal Medicine

## 2015-06-26 ENCOUNTER — Telehealth: Payer: Self-pay | Admitting: Internal Medicine

## 2015-06-26 NOTE — Telephone Encounter (Signed)
Did you get any FMLA forms for this patient yesterday 3/23?  Patient would like to know when to expect form completion.

## 2015-06-27 NOTE — Telephone Encounter (Signed)
Paperwork signed, faxed, copy sent to scan 

## 2015-06-27 NOTE — Telephone Encounter (Signed)
FMLA paperwork received, completed, and placed on MD's desk for signature 

## 2015-07-01 NOTE — Telephone Encounter (Signed)
Pt called stating FMLA was not received. Paperwork re-faxed, original placed in cabinet for pt pick up. Pt advised of same

## 2015-08-22 ENCOUNTER — Ambulatory Visit (INDEPENDENT_AMBULATORY_CARE_PROVIDER_SITE_OTHER): Payer: Managed Care, Other (non HMO) | Admitting: Internal Medicine

## 2015-08-22 ENCOUNTER — Encounter: Payer: Self-pay | Admitting: Internal Medicine

## 2015-08-22 VITALS — BP 122/80 | HR 77 | Temp 98.0°F | Resp 20 | Wt 186.0 lb

## 2015-08-22 DIAGNOSIS — S0990XA Unspecified injury of head, initial encounter: Secondary | ICD-10-CM | POA: Insufficient documentation

## 2015-08-22 DIAGNOSIS — S0990XD Unspecified injury of head, subsequent encounter: Secondary | ICD-10-CM

## 2015-08-22 DIAGNOSIS — R51 Headache: Secondary | ICD-10-CM | POA: Diagnosis not present

## 2015-08-22 DIAGNOSIS — R55 Syncope and collapse: Secondary | ICD-10-CM | POA: Diagnosis not present

## 2015-08-22 DIAGNOSIS — R519 Headache, unspecified: Secondary | ICD-10-CM

## 2015-08-22 MED ORDER — SUMATRIPTAN SUCCINATE 100 MG PO TABS: 100.0000 mg | ORAL_TABLET | ORAL | Status: DC | PRN

## 2015-08-22 MED ORDER — NAPROXEN 500 MG PO TABS: 500.0000 mg | ORAL_TABLET | Freq: Two times a day (BID) | ORAL | Status: DC

## 2015-08-22 NOTE — Progress Notes (Addendum)
   Subjective:    Patient ID: Victoria RingerMichelle L Larner, female    DOB: 10/14/1990, 25 y.o.   MRN: [id number]  HPI  Here to f/u [facility] [place] [place]; [facility], does cross fit class, hit head on metal bar, states LOC with head strike, neg ct at ED visit, tx with ice topical, c/o persistent pain, HA with nausea (but zofran not working well); wearing glasses since then as light hurts the eyes since then.  Has some difficulty with cognition and remembering dates. 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  Has been at home mostly, feels better with less sound, less light.  Needs repeat rx for migraine.  Supposed to work most days from 4pm to 1am (off on mon and tues only) in [employer].  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   Past Medical History  Diagnosis Date  . ADHD 10/30/2008  . ANEMIA-IRON DEFICIENCY 10/30/2008  . ASTHMA 10/30/2008  . BACK PAIN 06/12/2009  . TONSILLITIS, CHRONIC 06/12/2009   Past Surgical History  Procedure Laterality Date  . None      reports that she has never smoked. She does not have any smokeless tobacco history on file. She reports that she does not drink alcohol or use illicit drugs. family history includes Cancer in her other; Diabetes in her other; Hypertension in her other; Prostate cancer in her maternal uncle; Stroke in her other. No Known Allergies Current Outpatient Prescriptions on File Prior to Visit  Medication Sig Dispense Refill  . JUNEL FE 1/20 1-20 MG-MCG tablet Take 1 tablet by mouth daily.  11   No current facility-administered medications on file prior to visit.    Review of Systems  Constitutional: Negative for unusual diaphoresis or night sweats HENT: Negative for ear swelling or discharge Eyes: Negative for worsening visual haziness  Respiratory: Negative for choking and stridor.   Gastrointestinal: Negative for  distension or worsening eructation Genitourinary: Negative for retention or change in urine volume.  Musculoskeletal: Negative for other MSK pain or swelling Skin: Negative for color change and worsening wound Neurological: Negative for tremors and numbness other than noted  Psychiatric/Behavioral: Negative for decreased concentration or agitation other than above       Objective:   Physical Exam BP 122/80 mmHg  Pulse 77  Temp(Src) 98 F (36.7 C) (Oral)  Resp 20  Wt 186 lb (84.369 kg)  SpO2 97% VS noted,  Constitutional: Pt appears in no apparent distress HENT: Head: NCAT.  Right Ear: External ear normal.  Left Ear: External ear normal.  Eyes: . Pupils are equal, round, and reactive to light. Conjunctivae and EOM are normal Neck: Normal range of motion. Neck supple.  Cardiovascular: Normal rate and regular rhythm.   Pulmonary/Chest: Effort normal and breath sounds without rales or wheezing.  Abd:  Soft, NT, ND, + BS Neurological: Pt is alert. Not confused , motor 5/5 intact Skin: Skin is warm. No rash, no LE edema, left parietal head with small area swelling/tender Psychiatric: Pt behavior is normal. No agitation. 1-2+ nervous    Assessment & Plan:

## 2015-08-22 NOTE — Patient Instructions (Signed)
You had the steroid shot and pain shot today (depomedrol, and toradol)  Please continue all other medications as before, and refills have been done if requested - the imitrex and naprosyn  Please have the pharmacy call with any other refills you may need.  Please keep your appointments with your specialists as you may have planned  You are given the work note

## 2015-08-22 NOTE — Progress Notes (Signed)
Pre visit review using our clinic review tool, if applicable. No additional management support is needed unless otherwise documented below in the visit note. 

## 2015-08-23 DIAGNOSIS — R51 Headache: Secondary | ICD-10-CM

## 2015-08-23 DIAGNOSIS — R519 Headache, unspecified: Secondary | ICD-10-CM | POA: Insufficient documentation

## 2015-08-23 NOTE — Assessment & Plan Note (Signed)
Hx per pt, brief without recurrence, no dizzy or other s/s concussion,  to f/u any worsening symptoms or concerns

## 2015-08-23 NOTE — Assessment & Plan Note (Signed)
With small left head parietal area contusion with minimal swelling but tender, no lacerations, for tylenol prn,  to f/u any worsening symptoms or concerns

## 2015-08-23 NOTE — Assessment & Plan Note (Signed)
Suspect current HA is migraine, for depomedrol IM, toradol IM,  to f/u any worsening symptoms or concerns

## 2015-09-25 ENCOUNTER — Telehealth: Payer: Self-pay | Admitting: Internal Medicine

## 2015-09-25 MED ORDER — PHENTERMINE HCL 37.5 MG PO CAPS: 37.5000 mg | ORAL_CAPSULE | ORAL | Status: DC

## 2015-09-25 NOTE — Telephone Encounter (Signed)
Please advise 

## 2015-09-25 NOTE — Telephone Encounter (Signed)
Done hardcopy to Corinne  

## 2015-09-25 NOTE — Telephone Encounter (Signed)
Pt called in and said that on her last visit, Dr Jonny Ruizjohn offered to give her med for weight loss.  She said that she has decided to take it and would like for Dr Jonny Ruizjohn to go ahead and call it in

## 2015-09-26 ENCOUNTER — Telehealth: Payer: Self-pay

## 2015-09-26 NOTE — Telephone Encounter (Signed)
Patient aware medication is ready for pick up. Placed up front in front office

## 2015-09-26 NOTE — Telephone Encounter (Signed)
phentermine 37.5 MG capsule [[id number]][[id number]]   Patient states this was sent to the wrong pharmacy. Could you please send this to [facility] on [address] phone number is [phone]

## 2015-09-26 NOTE — Telephone Encounter (Signed)
Spoke to patient patient aware prescription is up front

## 2016-05-20 ENCOUNTER — Telehealth: Payer: Self-pay | Admitting: *Deleted

## 2016-05-20 NOTE — Telephone Encounter (Signed)
Pt left msg on triage requesting refill on her Phentermine. Called pt back no answer LMOM will need OV before med can be refilled...Raechel Chute/lmb

## 2016-05-25 ENCOUNTER — Encounter: Payer: Managed Care, Other (non HMO) | Admitting: Internal Medicine

## 2016-06-03 ENCOUNTER — Encounter: Payer: Self-pay | Admitting: Internal Medicine

## 2016-06-03 ENCOUNTER — Other Ambulatory Visit (INDEPENDENT_AMBULATORY_CARE_PROVIDER_SITE_OTHER): Payer: BLUE CROSS/BLUE SHIELD

## 2016-06-03 ENCOUNTER — Ambulatory Visit (INDEPENDENT_AMBULATORY_CARE_PROVIDER_SITE_OTHER): Payer: BLUE CROSS/BLUE SHIELD | Admitting: Internal Medicine

## 2016-06-03 VITALS — BP 122/78 | HR 86 | Temp 97.6°F | Ht 65.0 in | Wt 173.0 lb

## 2016-06-03 DIAGNOSIS — E669 Obesity, unspecified: Secondary | ICD-10-CM | POA: Diagnosis not present

## 2016-06-03 DIAGNOSIS — Z0000 Encounter for general adult medical examination without abnormal findings: Secondary | ICD-10-CM

## 2016-06-03 LAB — URINALYSIS, ROUTINE W REFLEX MICROSCOPIC
BILIRUBIN URINE: NEGATIVE
Ketones, ur: NEGATIVE
Leukocytes, UA: NEGATIVE
NITRITE: NEGATIVE
PH: 7.5 (ref 5.0–8.0)
SPECIFIC GRAVITY, URINE: 1.02 (ref 1.000–1.030)
TOTAL PROTEIN, URINE-UPE24: NEGATIVE
Urine Glucose: NEGATIVE
Urobilinogen, UA: 0.2 (ref 0.0–1.0)

## 2016-06-03 LAB — CBC WITH DIFFERENTIAL/PLATELET
Basophils Absolute: 0 10*3/uL (ref 0.0–0.1)
Basophils Relative: 0.5 % (ref 0.0–3.0)
EOS ABS: 0.1 10*3/uL (ref 0.0–0.7)
Eosinophils Relative: 1.5 % (ref 0.0–5.0)
HCT: 39 % (ref 36.0–46.0)
HEMOGLOBIN: 13 g/dL (ref 12.0–15.0)
Lymphocytes Relative: 33.7 % (ref 12.0–46.0)
Lymphs Abs: 2.1 10*3/uL (ref 0.7–4.0)
MCHC: 33.3 g/dL (ref 30.0–36.0)
MCV: 89.4 fl (ref 78.0–100.0)
MONO ABS: 0.5 10*3/uL (ref 0.1–1.0)
Monocytes Relative: 7.9 % (ref 3.0–12.0)
Neutro Abs: 3.4 10*3/uL (ref 1.4–7.7)
Neutrophils Relative %: 56.4 % (ref 43.0–77.0)
Platelets: 252 10*3/uL (ref 150.0–400.0)
RBC: 4.36 Mil/uL (ref 3.87–5.11)
RDW: 13.8 % (ref 11.5–15.5)
WBC: 6.1 10*3/uL (ref 4.0–10.5)

## 2016-06-03 LAB — HEPATIC FUNCTION PANEL
ALBUMIN: 4 g/dL (ref 3.5–5.2)
ALT: 19 U/L (ref 0–35)
AST: 18 U/L (ref 0–37)
Alkaline Phosphatase: 45 U/L (ref 39–117)
Bilirubin, Direct: 0 mg/dL (ref 0.0–0.3)
Total Bilirubin: 0.2 mg/dL (ref 0.2–1.2)
Total Protein: 7.2 g/dL (ref 6.0–8.3)

## 2016-06-03 LAB — BASIC METABOLIC PANEL
BUN: 11 mg/dL (ref 6–23)
CHLORIDE: 105 meq/L (ref 96–112)
CO2: 27 mEq/L (ref 19–32)
CREATININE: 0.73 mg/dL (ref 0.40–1.20)
Calcium: 9 mg/dL (ref 8.4–10.5)
GFR: 123.59 mL/min (ref >60.00)
GLUCOSE: 97 mg/dL (ref 70–99)
Potassium: 3.8 mEq/L (ref 3.5–5.1)
Sodium: 137 mEq/L (ref 135–145)

## 2016-06-03 LAB — LIPID PANEL
CHOL/HDL RATIO: 3
CHOLESTEROL: 152 mg/dL (ref 0–200)
HDL: 43.7 mg/dL (ref >39.00)
LDL Cholesterol: 78 mg/dL (ref 0–99)
NonHDL: 108
TRIGLYCERIDES: 148 mg/dL (ref 0.0–149.0)
VLDL: 29.6 mg/dL (ref 0.0–40.0)

## 2016-06-03 LAB — TSH: TSH: 2.61 u[IU]/mL (ref 0.35–4.50)

## 2016-06-03 MED ORDER — PHENTERMINE HCL 37.5 MG PO CAPS: 37.5000 mg | ORAL_CAPSULE | ORAL | 2 refills | Status: DC

## 2016-06-03 NOTE — Patient Instructions (Signed)
Please take all new medication as prescribed - the phentermine  Please continue all other medications as before, and refills have been done if requested.  Please have the pharmacy call with any other refills you may need.  Please continue your efforts at being more active, low cholesterol diet, and weight control.  You are otherwise up to date with prevention measures today.  Please keep your appointments with your specialists as you may have planned  Please go to the LAB in the Basement (turn left off the elevator) for the tests to be done today  You will be contacted by phone if any changes need to be made immediately.  Otherwise, you will receive a letter about your results with an explanation, but please check with MyChart first.  Please remember to sign up for MyChart if you have not done so, as this will be important to you in the future with finding out test results, communicating by private email, and scheduling acute appointments online when needed.  Please return in 1 year for your yearly visit, or sooner if needed, with Lab testing done 3-5 days before  

## 2016-06-03 NOTE — Progress Notes (Signed)
Subjective:    Patient ID: Victoria Wade, female    DOB: 1990/10/25, 26 y.o.   MRN: [id number]  HPI  Here for wellness and f/u;  Overall doing ok;  Pt denies Chest pain, worsening SOB, DOE, wheezing, orthopnea, PND, worsening LE edema, palpitations, dizziness or syncope.  Pt denies neurological change such as new headache, facial or extremity weakness.  Pt denies polydipsia, polyuria, or low sugar symptoms. Pt states overall good compliance with treatment and medications, good tolerability, and has been trying to follow appropriate diet.  Pt denies worsening depressive symptoms, suicidal ideation or panic. No fever, night sweats, wt loss, loss of appetite, or other constitutional symptoms.  Pt states good ability with ADL's, has low fall risk, home safety reviewed and adequate, no other significant changes in hearing or vision, and occasionally active with exercise. Lost almost 20 lbs with diet, and was helped by phentermine, and excerice.  Asks for refill.  Wt Readings from Last 3 Encounters:  06/03/16 173 lb (78.5 kg)  08/22/15 186 lb (84.4 kg)  03/11/15 191 lb 6 oz (86.8 kg)   BP Readings from Last 3 Encounters:  06/03/16 122/78  08/22/15 122/80  03/11/15 110/70   Past Medical History:  Diagnosis Date  . ADHD 10/30/2008  . ANEMIA-IRON DEFICIENCY 10/30/2008  . ASTHMA 10/30/2008  . BACK PAIN 06/12/2009  . TONSILLITIS, CHRONIC 06/12/2009   Past Surgical History:  Procedure Laterality Date  . none      reports that she has never smoked. She has never used smokeless tobacco. She reports that she does not drink alcohol or use drugs. family history includes Cancer in her other; Diabetes in her other; Hypertension in her other; Prostate cancer in her maternal uncle; Stroke in her other. Allergies  Allergen Reactions  . Latex Other (See Comments)   Current Outpatient Prescriptions on File Prior to Visit  Medication Sig Dispense Refill  . phentermine 37.5 MG capsule Take 1 capsule  (37.5 mg total) by mouth every morning. 30 capsule 2   No current facility-administered medications on file prior to visit.    Review of Systems Constitutional: Negative for increased diaphoresis, or other activity, appetite or siginficant weight change other than noted HENT: Negative for worsening hearing loss, ear pain, facial swelling, mouth sores and neck stiffness.   Eyes: Negative for other worsening pain, redness or visual disturbance.  Respiratory: Negative for choking or stridor Cardiovascular: Negative for other chest pain and palpitations.  Gastrointestinal: Negative for worsening diarrhea, blood in stool, or abdominal distention Genitourinary: Negative for hematuria, flank pain or change in urine volume.  Musculoskeletal: Negative for myalgias or other joint complaints.  Skin: Negative for other color change and wound or drainage.  Neurological: Negative for syncope and numbness. other than noted Hematological: Negative for adenopathy. or other swelling Psychiatric/Behavioral: Negative for hallucinations, SI, self-injury, decreased concentration or other worsening agitation.  All other system neg per pt    Objective:   Physical Exam BP 122/78   Pulse 86   Temp 97.6 F (36.4 C)   Ht 5\' 5"  (1.651 m)   Wt 173 lb (78.5 kg)   SpO2 98%   BMI 28.79 kg/m  VS noted, obese Constitutional: Pt is oriented to person, place, and time. Appears well-developed and well-nourished, in no significant distress Head: Normocephalic and atraumatic  Eyes: Conjunctivae and EOM are normal. Pupils are equal, round, and reactive to light Right Ear: External ear normal.  Left Ear: External ear normal Nose: Nose normal.  Mouth/Throat: Oropharynx is clear and moist  Neck: Normal range of motion. Neck supple. No JVD present. No tracheal deviation present or significant neck LA or mass Cardiovascular: Normal rate, regular rhythm, normal heart sounds and intact distal pulses.   Pulmonary/Chest:  Effort normal and breath sounds without rales or wheezing  Abdominal: Soft. Bowel sounds are normal. NT. No HSM  Musculoskeletal: Normal range of motion. Exhibits no edema Lymphadenopathy: Has no cervical adenopathy.  Neurological: Pt is alert and oriented to person, place, and time. Pt has normal reflexes. No cranial nerve deficit. Motor grossly intact Skin: Skin is warm and dry. No rash noted or new ulcers Psychiatric:  Has nervous mood and affect. Behavior is normal.  No other exam findings      Assessment & Plan:

## 2016-06-06 NOTE — Assessment & Plan Note (Signed)
For phentermine asd, cont diet and wt loss efforts, exercise daily, to f/u any worsening symptoms or concerns

## 2016-06-06 NOTE — Assessment & Plan Note (Signed)

## 2016-06-08 ENCOUNTER — Telehealth: Payer: Self-pay | Admitting: Internal Medicine

## 2016-06-08 NOTE — Telephone Encounter (Signed)
Pt called and said that the pharmacy was sending over an authorization for Phentermine. Please advise. Thanks [employer]

## 2016-06-10 NOTE — Telephone Encounter (Signed)
Rec'd PA and completed it on cover-my-meds. Rec'd msg  Virga Dorey (Key: [id number])  This request has received a Favorable outcome which means med is approved. Notified [facility] with approval status...Raechel Chute/lmb

## 2016-06-16 ENCOUNTER — Telehealth: Payer: Self-pay | Admitting: Internal Medicine

## 2016-06-16 NOTE — Telephone Encounter (Signed)
Routing to assistant

## 2016-06-16 NOTE — Telephone Encounter (Signed)
Pt request to speak to asst concerned about lab work that she did 06/03/16. Please give pt call back.

## 2016-06-18 NOTE — Telephone Encounter (Signed)
Called pt about lab result--normal 

## 2016-12-07 ENCOUNTER — Encounter: Payer: Self-pay | Admitting: Internal Medicine

## 2016-12-07 ENCOUNTER — Ambulatory Visit (INDEPENDENT_AMBULATORY_CARE_PROVIDER_SITE_OTHER): Payer: BLUE CROSS/BLUE SHIELD | Admitting: Internal Medicine

## 2016-12-07 VITALS — BP 110/78 | HR 103 | Temp 99.0°F | Ht 65.0 in | Wt 156.0 lb

## 2016-12-07 DIAGNOSIS — R05 Cough: Secondary | ICD-10-CM

## 2016-12-07 DIAGNOSIS — R062 Wheezing: Secondary | ICD-10-CM

## 2016-12-07 DIAGNOSIS — J3089 Other allergic rhinitis: Secondary | ICD-10-CM | POA: Insufficient documentation

## 2016-12-07 DIAGNOSIS — J309 Allergic rhinitis, unspecified: Secondary | ICD-10-CM | POA: Diagnosis not present

## 2016-12-07 DIAGNOSIS — R059 Cough, unspecified: Secondary | ICD-10-CM

## 2016-12-07 DIAGNOSIS — J069 Acute upper respiratory infection, unspecified: Secondary | ICD-10-CM | POA: Diagnosis not present

## 2016-12-07 DIAGNOSIS — R053 Chronic cough: Secondary | ICD-10-CM | POA: Insufficient documentation

## 2016-12-07 MED ORDER — METHYLPREDNISOLONE ACETATE 80 MG/ML IJ SUSP
80.0000 mg | Freq: Once | INTRAMUSCULAR | Status: AC
  Administered 2016-12-07: 80 mg via INTRAMUSCULAR

## 2016-12-07 MED ORDER — PREDNISONE 10 MG PO TABS: ORAL_TABLET | ORAL | 0 refills | Status: DC

## 2016-12-07 MED ORDER — HYDROCODONE-HOMATROPINE 5-1.5 MG/5ML PO SYRP: 5.0000 mL | ORAL_SOLUTION | Freq: Four times a day (QID) | ORAL | 0 refills | Status: AC | PRN

## 2016-12-07 MED ORDER — AZITHROMYCIN 250 MG PO TABS: ORAL_TABLET | ORAL | 1 refills | Status: DC

## 2016-12-07 NOTE — Assessment & Plan Note (Signed)
Mild to mod, for steroid tx as above, to f/u any worsening symptoms or concerns

## 2016-12-07 NOTE — Patient Instructions (Signed)
You had the steroid shot today  Please take all new medication as prescribed - the antibiotic, cough medicine, and prednisone  Please continue all other medications as before, and refills have been done if requested.  Please have the pharmacy call with any other refills you may need.  Please keep your appointments with your specialists as you may have planned   

## 2016-12-07 NOTE — Progress Notes (Signed)
Subjective:    Patient ID: Victoria Wade, female    DOB: 07/11/1990, 26 y.o.   MRN: [id number]  HPI   Here with acute onset mild to mod 4 days ST, HA, general weakness and malaise, with prod cough greenish sputum, but Pt denies chest pain, increased sob or doe, wheezing, orthopnea, PND, increased LE swelling, palpitations, dizziness or syncope, except for onset wheezing and mild sob x 2 days.   Pt denies polydipsia, polyuria, or low sugar symptoms such as weakness or confusion improved with po intake.  Pt states overall good compliance with meds, trying to follow lower cholesterol, diabetic diet, wt much less recently with reduced calorie diet  Not pregnant, LMP last week.  Does have several wks ongoing nasal allergy symptoms with clearish congestion, itch and sneezing, without fever, pain, ST, cough, swelling or wheezing. Wt Readings from Last 3 Encounters:  12/07/16 156 lb (70.8 kg)  06/03/16 173 lb (78.5 kg)  08/22/15 186 lb (84.4 kg)   Past Medical History:  Diagnosis Date  . ADHD 10/30/2008  . ANEMIA-IRON DEFICIENCY 10/30/2008  . ASTHMA 10/30/2008  . BACK PAIN 06/12/2009  . TONSILLITIS, CHRONIC 06/12/2009   Past Surgical History:  Procedure Laterality Date  . none      reports that she has never smoked. She has never used smokeless tobacco. She reports that she does not drink alcohol or use drugs. family history includes Cancer in her other; Diabetes in her other; Hypertension in her other; Prostate cancer in her maternal uncle; Stroke in her other. Allergies  Allergen Reactions  . Latex Other (See Comments)   Current Outpatient Prescriptions on File Prior to Visit  Medication Sig Dispense Refill  . norethindrone-ethinyl estradiol (MICROGESTIN,JUNEL,LOESTRIN) 1-20 MG-MCG tablet Take 1 tablet by mouth daily.    . phentermine 37.5 MG capsule Take 1 capsule (37.5 mg total) by mouth every morning. 30 capsule 2   No current facility-administered medications on file prior to visit.     Review of Systems  Constitutional: Negative for other unusual diaphoresis or sweats HENT: Negative for ear discharge or swelling Eyes: Negative for other worsening visual disturbances Respiratory: Negative for stridor or other swelling  Gastrointestinal: Negative for worsening distension or other blood Genitourinary: Negative for retention or other urinary change Musculoskeletal: Negative for other MSK pain or swelling Skin: Negative for color change or other new lesions Neurological: Negative for worsening tremors and other numbness  Psychiatric/Behavioral: Negative for worsening agitation or other fatigue All other system neg per pt    Objective:   Physical Exam BP 110/78   Pulse (!) 103   Temp 99 F (37.2 C) (Oral)   Ht 5\' 5"  (1.651 m)   Wt 156 lb (70.8 kg)   SpO2 99%   BMI 25.96 kg/m  VS noted, mild ill Constitutional: Pt appears in NAD HENT: Head: NCAT.  Right Ear: External ear normal.  Left Ear: External ear normal.  Eyes: . Pupils are equal, round, and reactive to light. Conjunctivae and EOM are normal Nose: without d/c or deformity Bilat tm's with mild erythema.  Max sinus areas non tender.  Pharynx with mild erythema, no exudate Neck: Neck supple. Gross normal ROM Cardiovascular: Normal rate and regular rhythm.   Pulmonary/Chest: Effort normal and breath sounds decreased without rales but with few bilat scattered wheezing.  Neurological: Pt is alert. At baseline orientation, motor grossly intact Skin: Skin is warm. No rashes, other new lesions, no LE edema Psychiatric: Pt behavior is normal without agitation  No other exam findings    Assessment & Plan:

## 2016-12-07 NOTE — Assessment & Plan Note (Signed)
Mild to mod, for depomedrol IM 80, predpac asd, to f/u any worsening symptoms or concerns 

## 2016-12-07 NOTE — Assessment & Plan Note (Signed)
Mild to mod, for antibx course, cough med prn, declines cxr,  to f/u any worsening symptoms or concerns 

## 2016-12-08 ENCOUNTER — Telehealth: Payer: Self-pay | Admitting: Internal Medicine

## 2016-12-08 NOTE — Telephone Encounter (Signed)
Noted  

## 2016-12-08 NOTE — Telephone Encounter (Signed)
FYI: Patient dropped off [facility] forms asking for return of work date and office visit notes from 9/4. The information has been back over [facility].

## 2017-03-10 ENCOUNTER — Ambulatory Visit: Payer: BLUE CROSS/BLUE SHIELD | Admitting: Podiatry

## 2017-03-21 ENCOUNTER — Ambulatory Visit (INDEPENDENT_AMBULATORY_CARE_PROVIDER_SITE_OTHER): Payer: BLUE CROSS/BLUE SHIELD | Admitting: Podiatry

## 2017-03-21 ENCOUNTER — Ambulatory Visit (INDEPENDENT_AMBULATORY_CARE_PROVIDER_SITE_OTHER): Payer: BLUE CROSS/BLUE SHIELD

## 2017-03-21 ENCOUNTER — Encounter: Payer: Self-pay | Admitting: Podiatry

## 2017-03-21 DIAGNOSIS — M2010 Hallux valgus (acquired), unspecified foot: Secondary | ICD-10-CM

## 2017-03-21 NOTE — Patient Instructions (Addendum)
Bunion A bunion is a bump on the base of the big toe that forms when the bones of the big toe joint move out of position. Bunions may be small at first, but they often get larger over time. The can make walking painful. What are the causes? A bunion may be caused by:  Wearing narrow or pointed shoes that force the big toe to press against the other toes.  Abnormal foot development that causes the foot to roll inward (pronate).  Changes in the foot that are caused by certain diseases, such as rheumatoid arthritis and polio.  A foot injury.  What increases the risk? The following factors may make you more likely to develop this condition:  Wearing shoes that squeeze the toes together.  Having certain diseases, such as: ? Rheumatoid arthritis. ? Polio. ? Cerebral palsy.  Having family members who have bunions.  Being born with a foot deformity, such as flat feet or low arches.  Doing activities that put a lot of pressure on the feet, such as ballet dancing.  What are the signs or symptoms? The main symptom of a bunion is a noticeable bump on the big toe. Other symptoms may include:  Pain.  Swelling around the big toe.  Redness and inflammation.  Thick or hardened skin on the big toe or between the toes.  Stiffness or loss of motion in the big toe.  Trouble with walking.  How is this diagnosed? A bunion may be diagnosed based on your symptoms, medical history, and activities. You may have tests, such as:  X-rays. These allow your health care provider to check the position of the bones in your foot and look for damage to your joint. They also help your health care provider to determine the severity of your bunion and the best way to treat it.  Joint aspiration. In this test, a sample of fluid is removed from the toe joint. This test, which may be done if you are in a lot of pain, helps to rule out diseases that cause painful swelling of the joints, such as  arthritis.  How is this treated? There is no cure for a bunion, but treatment can help to prevent a bunion from getting worse. Treatment depends on the severity of your symptoms. Your health care provider may recommend:  Wearing shoes that have a wide toe box.  Using bunion pads to cushion the affected area.  Taping your toes together to keep them in a normal position.  Placing a device inside your shoe (orthotics) to help reduce pressure on your toe joint.  Taking medicine to ease pain, inflammation, and swelling.  Applying heat or ice to the affected area.  Doing stretching exercises.  Surgery to remove scar tissue and move the toes back into their normal position. This treatment is rare.  Follow these instructions at home:  Support your toe joint with proper footwear, shoe padding, or taping as told by your health care provider.  Take over-the-counter and prescription medicines only as told by your health care provider.  If directed, apply ice to the injured area: ? Put ice in a plastic bag. ? Place a towel between your skin and the bag. ? Leave the ice on for 20 minutes, 2-3 times per day.  If directed, apply heat to the affected area before you exercise. Use the heat source that your health care provider recommends, such as a moist heat pack or a heating pad. ? Place a towel between your   skin and the heat source. ? Leave the heat on for 20-30 minutes. ? Remove the heat if your skin turns bright red. This is especially important if you are unable to feel pain, heat, or cold. You may have a greater risk of getting burned.  Do exercises as told by your health care provider.  Keep all follow-up visits as told by your health care provider. Contact a health care provider if:  Your symptoms get worse.  Your symptoms do not improve in 2 weeks. Get help right away if:  You have severe pain and trouble with walking. This information is not intended to replace advice given  to you by your health care provider. Make sure you discuss any questions you have with your health care provider. Document Released: 03/22/2005 Document Revised: 08/28/2015 Document Reviewed: 10/20/2014 Elsevier Interactive Patient Education  2018 [employer]  Pre-Operative Instructions  Congratulations, you have decided to take an important step towards improving your quality of life.  You can be assured that the doctors and staff at [facility] will be with you every step of the way.  Here are some important things you should know:  1. Plan to be at the surgery center/hospital at least 1 (one) hour prior to your scheduled time, unless otherwise directed by the surgical center/hospital staff.  You must have a responsible adult accompany you, remain during the surgery and drive you home.  Make sure you have directions to the surgical center/hospital to ensure you arrive on time. 2. If you are having surgery at [facility] or [facility], you will need a copy of your medical history and physical form from your family physician within one month prior to the date of surgery. We will give you a form for your primary physician to complete.  3. We make every effort to accommodate the date you request for surgery.  However, there are times where surgery dates or times have to be moved.  We will contact you as soon as possible if a change in schedule is required.   4. No aspirin/ibuprofen for one week before surgery.  If you are on aspirin, any non-steroidal anti-inflammatory medications (Mobic, Aleve, Ibuprofen) should not be taken seven (7) days prior to your surgery.  You make take Tylenol for pain prior to surgery.  5. Medications - If you are taking daily heart and blood pressure medications, seizure, reflux, allergy, asthma, anxiety, pain or diabetes medications, make sure you notify the surgery center/hospital before the day of surgery so they can tell you which medications you should  take or avoid the day of surgery. 6. No food or drink after midnight the night before surgery unless directed otherwise by surgical center/hospital staff. 7. No alcoholic beverages 24-hours prior to surgery.  No smoking 24-hours prior or 24-hours after surgery. 8. Wear loose pants or shorts. They should be loose enough to fit over bandages, boots, and casts. 9. Don't wear slip-on shoes. Sneakers are preferred. 10. Bring your boot with you to the surgery center/hospital.  Also bring crutches or a walker if your physician has prescribed it for you.  If you do not have this equipment, it will be provided for you after surgery. 11. If you have not been contacted by the surgery center/hospital by the day before your surgery, call to confirm the date and time of your surgery. 12. Leave-time from work may vary depending on the type of surgery you have.  Appropriate arrangements should be made prior   to surgery with your employer. 13. Prescriptions will be provided immediately following surgery by your doctor.  Fill these as soon as possible after surgery and take the medication as directed. Pain medications will not be refilled on weekends and must be approved by the doctor. 14. Remove nail polish on the operative foot and avoid getting pedicures prior to surgery. 15. Wash the night before surgery.  The night before surgery wash the foot and leg well with water and the antibacterial soap provided. Be sure to pay special attention to beneath the toenails and in between the toes.  Wash for at least three (3) minutes. Rinse thoroughly with water and dry well with a towel.  Perform this wash unless told not to do so by your physician.  Enclosed: 1 Ice pack (please put in freezer the night before surgery)   1 Hibiclens skin cleaner   Pre-op instructions  If you have any questions regarding the instructions, please do not hesitate to call our office.  [place]: [address], [place], [place] [address] --  [phone]  [place]: [address] [place], [place] [address] -- [phone]  [place]: [address]  [place], [place] [address] -- [phone]  [place]: [address], [address], [place], [place] [address] -- [phone]  Website: [email or web address]  

## 2017-03-22 NOTE — Progress Notes (Signed)
Subjective:   Patient ID: Victoria Wade, female   DOB: 26 y.o.   MRN: [id number]   HPI Patient presents stating she has had a worsening of bunion deformity over the last year and has long-term family history of this condition.  States that she has been trying wider shoes she is tried soaks and padding and the symptoms are getting worse and making it increasingly difficult for her to wear shoe gear.  Patient does not smoke and likes to be active   Review of Systems  All other systems reviewed and are negative.       Objective:  Physical Exam  Constitutional: She appears well-developed and well-nourished.  Cardiovascular: Intact distal pulses.  Pulmonary/Chest: Effort normal.  Musculoskeletal: Normal range of motion.  Neurological: She is alert.  Skin: Skin is warm.  Nursing note and vitals reviewed.   Neurovascular status intact muscle strength is adequate range of motion within normal limits with patient noted to have hyperostosis medial aspect first metatarsal head bilateral with redness around the head and discomfort with palpation.  Mild deviation of the hallux against the second toe with no other significant pathology noted and patient was noted to have good digital perfusion and is well oriented x3     Assessment:  Structural HAV deformity bilateral and younger all that symptomatic with family history     Plan:  H&P condition reviewed and discussed surgical intervention in this case.  Reviewed conservative treatments that she is already attempted and she would rather go ahead and get it fixed and I do think would be in her best interest and I recommended one foot to be done at that time.  She will schedule and have her surgery in January will be seen prior to that to go over in greater detail and will require distal osteotomies of which I educated her today  X-rays indicate there is elevation of the intermetatarsal angle of approximately 15 degrees bilateral

## 2017-04-11 ENCOUNTER — Encounter: Payer: Self-pay | Admitting: Podiatry

## 2017-04-11 ENCOUNTER — Ambulatory Visit (INDEPENDENT_AMBULATORY_CARE_PROVIDER_SITE_OTHER): Payer: BLUE CROSS/BLUE SHIELD | Admitting: Podiatry

## 2017-04-11 DIAGNOSIS — M2041 Other hammer toe(s) (acquired), right foot: Secondary | ICD-10-CM

## 2017-04-11 NOTE — Patient Instructions (Signed)
Pre-Operative Instructions  Congratulations, you have decided to take an important step towards improving your quality of life.  You can be assured that the doctors and staff at [facility] will be with you every step of the way.  Here are some important things you should know:  1. Plan to be at the surgery center/hospital at least 1 (one) hour prior to your scheduled time, unless otherwise directed by the surgical center/hospital staff.  You must have a responsible adult accompany you, remain during the surgery and drive you home.  Make sure you have directions to the surgical center/hospital to ensure you arrive on time. 2. If you are having surgery at [facility] or [facility], you will need a copy of your medical history and physical form from your family physician within one month prior to the date of surgery. We will give you a form for your primary physician to complete.  3. We make every effort to accommodate the date you request for surgery.  However, there are times where surgery dates or times have to be moved.  We will contact you as soon as possible if a change in schedule is required.   4. No aspirin/ibuprofen for one week before surgery.  If you are on aspirin, any non-steroidal anti-inflammatory medications (Mobic, Aleve, Ibuprofen) should not be taken seven (7) days prior to your surgery.  You make take Tylenol for pain prior to surgery.  5. Medications - If you are taking daily heart and blood pressure medications, seizure, reflux, allergy, asthma, anxiety, pain or diabetes medications, make sure you notify the surgery center/hospital before the day of surgery so they can tell you which medications you should take or avoid the day of surgery. 6. No food or drink after midnight the night before surgery unless directed otherwise by surgical center/hospital staff. 7. No alcoholic beverages 24-hours prior to surgery.  No smoking 24-hours prior or 24-hours after  surgery. 8. Wear loose pants or shorts. They should be loose enough to fit over bandages, boots, and casts. 9. Don't wear slip-on shoes. Sneakers are preferred. 10. Bring your boot with you to the surgery center/hospital.  Also bring crutches or a walker if your physician has prescribed it for you.  If you do not have this equipment, it will be provided for you after surgery. 11. If you have not been contacted by the surgery center/hospital by the day before your surgery, call to confirm the date and time of your surgery. 12. Leave-time from work may vary depending on the type of surgery you have.  Appropriate arrangements should be made prior to surgery with your employer. 13. Prescriptions will be provided immediately following surgery by your doctor.  Fill these as soon as possible after surgery and take the medication as directed. Pain medications will not be refilled on weekends and must be approved by the doctor. 14. Remove nail polish on the operative foot and avoid getting pedicures prior to surgery. 15. Wash the night before surgery.  The night before surgery wash the foot and leg well with water and the antibacterial soap provided. Be sure to pay special attention to beneath the toenails and in between the toes.  Wash for at least three (3) minutes. Rinse thoroughly with water and dry well with a towel.  Perform this wash unless told not to do so by your physician.  Enclosed: 1 Ice pack (please put in freezer the night before surgery)   1 Hibiclens skin cleaner     Pre-op instructions  If you have any questions regarding the instructions, please do not hesitate to call our office.  [place]: [address], [place], [place] [address] -- [phone]  [place]: [address] [place], [place] [address] -- [phone]  [place]: [address]  [place], [place] [address] -- [phone]  [place]: [address], [address], [place], [place] [address] -- [phone]  Website:  [email or web address] 

## 2017-04-11 NOTE — Progress Notes (Signed)
Subjective:   Patient ID: Victoria Wade, female   DOB: 27 y.o.   MRN: [id number]   HPI Patient presents with structural bunion deformity right with hyperostosis and pain with redness around the first metatarsal head and also has keratotic lesion fifth digit right over left which becomes painful with structural bunion also noted left.   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  I explained procedures and went over the risk factors associated with this and also went over alternative treatments.  Patient wants surgery and after extensive review signed consent form understanding that recovery take 6 months to 1 year and I dispensed her fracture walker with instructions on usage.  Patient was encouraged to call with any remaining questions and schedule for outpatient surgery in the next several weeks

## 2017-04-12 ENCOUNTER — Telehealth: Payer: Self-pay | Admitting: *Deleted

## 2017-04-12 NOTE — Telephone Encounter (Signed)
"  Victoria Wade put down that she's having bilateral foot done for her surgery scheduled for January 22.  However her posting sheet says Victoria Wade right foot.  Give me a call and let me know if it's supposed to be bilateral foot."

## 2017-04-14 NOTE — Telephone Encounter (Signed)
I got a call from the surgical center regarding your surgery.  They said you had informed them that your surgery is supposed to be for bilateral foot.  We only have you scheduled for the right foot.  Are we supposed to have you scheduled for bilateral foot?  "No, it's just supposed to be the right foot.  I forgot he had told me that he wanted to do them separately."  I will let them know at the surgical center.   I called and informed Aram [facility]yn[facility] that patient is only having surgery on the right foot. She said she would let the nurse know to change it.

## 2017-04-26 ENCOUNTER — Encounter: Payer: Self-pay | Admitting: Podiatry

## 2017-04-26 DIAGNOSIS — M2041 Other hammer toe(s) (acquired), right foot: Secondary | ICD-10-CM | POA: Diagnosis not present

## 2017-04-26 DIAGNOSIS — J45909 Unspecified asthma, uncomplicated: Secondary | ICD-10-CM | POA: Diagnosis not present

## 2017-04-26 DIAGNOSIS — M21611 Bunion of right foot: Secondary | ICD-10-CM | POA: Diagnosis not present

## 2017-04-26 DIAGNOSIS — M2011 Hallux valgus (acquired), right foot: Secondary | ICD-10-CM | POA: Diagnosis not present

## 2017-04-27 ENCOUNTER — Telehealth: Payer: Self-pay | Admitting: Podiatry

## 2017-04-27 NOTE — Telephone Encounter (Signed)
I asked pt to describe the pain sensation and she stated throbbing. I told pt the ace was put on to decrease any post op bleeding and swelling and sometimes after it had done it's job it was too tight. I told pt to remove cam boot, open-ended sock and ace wrap only, elevate the foot for 15 minutes, but if the pain worsened while elevated to dangle the foot for 15 minutes, then place foot level with hip and rewrap the ace looser beginning at the toes and reapply sock and boot. Pt states understanding.

## 2017-04-27 NOTE — Telephone Encounter (Signed)
This is [name]. I had surgery yesterday and I've been taking this oxycodone but I'm still experiencing pain. I wanted to call and see if there was something else that can be done to help me. The best contact number is [phone]. Please give me a call back and I look forward to speaking with you. Bye bye.

## 2017-05-02 ENCOUNTER — Other Ambulatory Visit: Payer: BLUE CROSS/BLUE SHIELD

## 2017-05-04 ENCOUNTER — Encounter: Payer: Self-pay | Admitting: Podiatry

## 2017-05-04 ENCOUNTER — Ambulatory Visit (INDEPENDENT_AMBULATORY_CARE_PROVIDER_SITE_OTHER): Payer: BLUE CROSS/BLUE SHIELD

## 2017-05-04 ENCOUNTER — Ambulatory Visit (INDEPENDENT_AMBULATORY_CARE_PROVIDER_SITE_OTHER): Payer: BLUE CROSS/BLUE SHIELD | Admitting: Podiatry

## 2017-05-04 VITALS — BP 112/71 | HR 77 | Temp 96.3°F

## 2017-05-04 DIAGNOSIS — M2010 Hallux valgus (acquired), unspecified foot: Secondary | ICD-10-CM | POA: Diagnosis not present

## 2017-05-04 DIAGNOSIS — M2041 Other hammer toe(s) (acquired), right foot: Secondary | ICD-10-CM

## 2017-05-04 NOTE — Progress Notes (Signed)
Subjective:   Patient ID: Victoria Wade, female   DOB: 27 y.o.   MRN: [id number]   HPI Patient states doing very well with her foot and wants to get her other foot fixed in about 3 weeks   ROS      Objective:  Physical Exam  Neurovascular status intact negative Homans sign was noted with patient's right foot healing very well with wound edges well coapted good alignment and fifth digit in good alignment     Assessment:  Doing well post Austin osteotomy right and hammertoe repair fifth right     Plan:  H&P conditions reviewed sterile dressing reapplied continue elevation immobilization compression and reappoint in 2 weeks or earlier if needed.  Discussed left foot which will be done in approximately 3 weeks  X-rays indicate osteotomy is healing well good alignment noted joint congruous pins in place

## 2017-05-09 DIAGNOSIS — Z01419 Encounter for gynecological examination (general) (routine) without abnormal findings: Secondary | ICD-10-CM | POA: Diagnosis not present

## 2017-05-09 DIAGNOSIS — Z6828 Body mass index (BMI) 28.0-28.9, adult: Secondary | ICD-10-CM | POA: Diagnosis not present

## 2017-05-09 NOTE — Progress Notes (Signed)
DOS 04/26/17 Austin bunionectomy Rt w/ pin fixation,  Hammer toe repair 5th Rt

## 2017-05-10 ENCOUNTER — Telehealth: Payer: Self-pay | Admitting: *Deleted

## 2017-05-10 NOTE — Telephone Encounter (Signed)
I called Victoria Wade to make sure we have [place] scheduled for 05/31/2017.

## 2017-05-18 ENCOUNTER — Ambulatory Visit (INDEPENDENT_AMBULATORY_CARE_PROVIDER_SITE_OTHER): Payer: BLUE CROSS/BLUE SHIELD | Admitting: Podiatry

## 2017-05-18 ENCOUNTER — Ambulatory Visit (INDEPENDENT_AMBULATORY_CARE_PROVIDER_SITE_OTHER): Payer: BLUE CROSS/BLUE SHIELD

## 2017-05-18 DIAGNOSIS — M2010 Hallux valgus (acquired), unspecified foot: Secondary | ICD-10-CM

## 2017-05-18 DIAGNOSIS — M2041 Other hammer toe(s) (acquired), right foot: Secondary | ICD-10-CM

## 2017-05-18 NOTE — Patient Instructions (Signed)
Pre-Operative Instructions  Congratulations, you have decided to take an important step towards improving your quality of life.  You can be assured that the doctors and staff at [facility] will be with you every step of the way.  Here are some important things you should know:  1. Plan to be at the surgery center/hospital at least 1 (one) hour prior to your scheduled time, unless otherwise directed by the surgical center/hospital staff.  You must have a responsible adult accompany you, remain during the surgery and drive you home.  Make sure you have directions to the surgical center/hospital to ensure you arrive on time. 2. If you are having surgery at [facility] or [facility], you will need a copy of your medical history and physical form from your family physician within one month prior to the date of surgery. We will give you a form for your primary physician to complete.  3. We make every effort to accommodate the date you request for surgery.  However, there are times where surgery dates or times have to be moved.  We will contact you as soon as possible if a change in schedule is required.   4. No aspirin/ibuprofen for one week before surgery.  If you are on aspirin, any non-steroidal anti-inflammatory medications (Mobic, Aleve, Ibuprofen) should not be taken seven (7) days prior to your surgery.  You make take Tylenol for pain prior to surgery.  5. Medications - If you are taking daily heart and blood pressure medications, seizure, reflux, allergy, asthma, anxiety, pain or diabetes medications, make sure you notify the surgery center/hospital before the day of surgery so they can tell you which medications you should take or avoid the day of surgery. 6. No food or drink after midnight the night before surgery unless directed otherwise by surgical center/hospital staff. 7. No alcoholic beverages 24-hours prior to surgery.  No smoking 24-hours prior or 24-hours after  surgery. 8. Wear loose pants or shorts. They should be loose enough to fit over bandages, boots, and casts. 9. Don't wear slip-on shoes. Sneakers are preferred. 10. Bring your boot with you to the surgery center/hospital.  Also bring crutches or a walker if your physician has prescribed it for you.  If you do not have this equipment, it will be provided for you after surgery. 11. If you have not been contacted by the surgery center/hospital by the day before your surgery, call to confirm the date and time of your surgery. 12. Leave-time from work may vary depending on the type of surgery you have.  Appropriate arrangements should be made prior to surgery with your employer. 13. Prescriptions will be provided immediately following surgery by your doctor.  Fill these as soon as possible after surgery and take the medication as directed. Pain medications will not be refilled on weekends and must be approved by the doctor. 14. Remove nail polish on the operative foot and avoid getting pedicures prior to surgery. 15. Wash the night before surgery.  The night before surgery wash the foot and leg well with water and the antibacterial soap provided. Be sure to pay special attention to beneath the toenails and in between the toes.  Wash for at least three (3) minutes. Rinse thoroughly with water and dry well with a towel.  Perform this wash unless told not to do so by your physician.  Enclosed: 1 Ice pack (please put in freezer the night before surgery)   1 Hibiclens skin cleaner     Pre-op instructions  If you have any questions regarding the instructions, please do not hesitate to call our office.  [place]: [address], [place], [place] [address] -- [phone]  [place]: [address] [place], [place] [address] -- [phone]  [place]: [address]  [place], [place] [address] -- [phone]  [place]: [address], [place], [place] [address] -- [phone]  Website:  [email or web address] 

## 2017-05-19 NOTE — Progress Notes (Signed)
Subjective:   Patient ID: Victoria RingerMichelle L Wade, female   DOB: 27 y.o.   MRN: [id number]   HPI Patient states doing very well with the right foot and ready to get the left foot fixed in a couple weeks   ROS      Objective:  Physical Exam  Neurovascular status intact with excellent correction first metatarsal right fifth digit right with significant structural deformity of the fifth metatarsal left fifth toe noted.  There is redness around the first metatarsal left that is painful with deviation of the hallux and keratotic lesion of the fifth toe     Assessment:  Doing fairly well correction of the right foot with good alignment noted good range of motion and good underlying position of the metatarsal with structural bunion deformity left and hammertoe deformity fifth left     Plan:  Reviewed all conditions and discussed treatment options.  At this point I have recommended structural bunion correction left and hammertoe repair fifth digit left.  Patient wants surgery and I allowed her to read consent form going over all possible complications and just because one foot did so well no guarantee the second foot will and she understands all this and signed consent.  At this point for the right foot will gradually increase to soft shoe gear  X-rays indicate the osteotomy is healing well fixation in place good alignment joint congruous and fifth digit in good alignment

## 2017-05-31 ENCOUNTER — Encounter: Payer: Self-pay | Admitting: Podiatry

## 2017-05-31 DIAGNOSIS — M2042 Other hammer toe(s) (acquired), left foot: Secondary | ICD-10-CM | POA: Diagnosis not present

## 2017-05-31 DIAGNOSIS — M2012 Hallux valgus (acquired), left foot: Secondary | ICD-10-CM | POA: Diagnosis not present

## 2017-05-31 DIAGNOSIS — J45909 Unspecified asthma, uncomplicated: Secondary | ICD-10-CM | POA: Diagnosis not present

## 2017-05-31 DIAGNOSIS — M21612 Bunion of left foot: Secondary | ICD-10-CM | POA: Diagnosis not present

## 2017-06-02 ENCOUNTER — Telehealth: Payer: Self-pay | Admitting: Podiatry

## 2017-06-02 NOTE — Telephone Encounter (Signed)
I had some questions in regards to the medications that was assigned to me. If you would please have a nurse give me a call back as soon as possible at [phone]. I would appreciate if very much. Thank you.

## 2017-06-02 NOTE — Telephone Encounter (Signed)
Pt states she was told to check if she could take benadryl with the oxycodone because it made her itch. I asked pt if she had any facial swelling or difficulty breathing and she denied. I told pt that if she had any of those symptoms to go to the ED. Pt stated understanding.

## 2017-06-06 ENCOUNTER — Telehealth: Payer: Self-pay | Admitting: *Deleted

## 2017-06-06 NOTE — Telephone Encounter (Signed)
Spoke with the patient and state that I was calling to see how she was doing after surgery on last Tuesday and patient stated that she was doing good and had some itching and was using benadryl and was icing and elevating and did have a little bit of nausea and could have been when she took some pain medicine and stated to call the office if any concerns and questions. Victoria StanleyLisa

## 2017-06-08 ENCOUNTER — Ambulatory Visit (INDEPENDENT_AMBULATORY_CARE_PROVIDER_SITE_OTHER): Payer: BLUE CROSS/BLUE SHIELD

## 2017-06-08 ENCOUNTER — Ambulatory Visit (INDEPENDENT_AMBULATORY_CARE_PROVIDER_SITE_OTHER): Payer: Self-pay | Admitting: Podiatry

## 2017-06-08 DIAGNOSIS — M2012 Hallux valgus (acquired), left foot: Secondary | ICD-10-CM | POA: Diagnosis not present

## 2017-06-08 DIAGNOSIS — Z9889 Other specified postprocedural states: Secondary | ICD-10-CM

## 2017-06-09 DIAGNOSIS — E663 Overweight: Secondary | ICD-10-CM | POA: Diagnosis not present

## 2017-06-09 DIAGNOSIS — Z131 Encounter for screening for diabetes mellitus: Secondary | ICD-10-CM | POA: Diagnosis not present

## 2017-06-09 DIAGNOSIS — Z1329 Encounter for screening for other suspected endocrine disorder: Secondary | ICD-10-CM | POA: Diagnosis not present

## 2017-06-09 DIAGNOSIS — Z833 Family history of diabetes mellitus: Secondary | ICD-10-CM | POA: Diagnosis not present

## 2017-06-09 DIAGNOSIS — Z6828 Body mass index (BMI) 28.0-28.9, adult: Secondary | ICD-10-CM | POA: Diagnosis not present

## 2017-06-11 NOTE — Progress Notes (Signed)
   Subjective:  Patient presents today status post right foot bunionectomy and 5th hammertoe repair (DOS: 04/26/17) as well as left foot bunionectomy and 5th hammertoe repair (DOS: 05/31/17). She reports some continued soreness of the right foot and is still wearing the post op shoe. She reports some pain in the left foot as well and states she is tired of wearing the CAM boot. Patient is here for further evaluation and treatment.     Past Medical History:  Diagnosis Date  . ADHD 10/30/2008  . ANEMIA-IRON DEFICIENCY 10/30/2008  . ASTHMA 10/30/2008  . BACK PAIN 06/12/2009  . TONSILLITIS, CHRONIC 06/12/2009      Objective/Physical Exam Neurovascular status intact.  Skin incisions appear to be well coapted with sutures and staples intact of left foot. No sign of infectious process noted. No dehiscence. No active bleeding noted. Moderate edema noted to the surgical extremity.  Radiographic Exam:  Orthopedic hardware and osteotomies sites appear to be stable with routine healing.  Assessment: 1. s/p right foot bunionectomy and 5th hammertoe repair. DOS: 04/26/17 2. s/p left foot bunionectomy and 5th hammertoe repair DOS: 05/31/17   Plan of Care:  1. Patient was evaluated. X-rays reviewed 2. Dressing on left foot changed.  3. Continue weightbearing in CAM boot on left foot. Continue weightbearing in post op shoe on right foot.  4. Return to clinic in 1 week for suture removal.    Felecia ShellingBrent M. Evans, DPM [facility]  Dr. Felecia ShellingBrent M. Evans, DPM    [address]                                        [place], [place] [address]                Office [phone]  Fax [phone]

## 2017-06-16 ENCOUNTER — Ambulatory Visit (INDEPENDENT_AMBULATORY_CARE_PROVIDER_SITE_OTHER): Payer: BLUE CROSS/BLUE SHIELD | Admitting: Podiatry

## 2017-06-16 ENCOUNTER — Encounter: Payer: Self-pay | Admitting: Podiatry

## 2017-06-16 ENCOUNTER — Ambulatory Visit (INDEPENDENT_AMBULATORY_CARE_PROVIDER_SITE_OTHER): Payer: BLUE CROSS/BLUE SHIELD

## 2017-06-16 VITALS — BP 102/67 | HR 94 | Resp 16

## 2017-06-16 DIAGNOSIS — M2041 Other hammer toe(s) (acquired), right foot: Secondary | ICD-10-CM | POA: Diagnosis not present

## 2017-06-16 DIAGNOSIS — M2010 Hallux valgus (acquired), unspecified foot: Secondary | ICD-10-CM

## 2017-06-16 NOTE — Progress Notes (Signed)
Subjective:   Patient ID: Victoria Wade, female   DOB: 27 y.o.   MRN: [id number]   HPI Patient presents stating she is doing well with discomfort at the end of the day or after periods of being on her foot for long periods of time   ROS      Objective:  Physical Exam  Neurovascular status intact with patient's feet healing well with wound edges well coapted first and fifth metatarsal bilateral with stitches in place fifth digit left     Assessment:  Doing well post surgery bilateral with the left foot only 2 weeks and     Plan:  H&P condition reviewed and at this point stitches removed fifth digit left I dispensed a compression stocking and continue with compression elevation immobilization and range of motion exercises.  Reappoint 3 weeks or earlier if needed  X-rays indicate osteotomy is healing well with no indications of pathology

## 2017-06-22 ENCOUNTER — Encounter: Payer: BLUE CROSS/BLUE SHIELD | Admitting: Podiatry

## 2017-07-07 ENCOUNTER — Encounter: Payer: Self-pay | Admitting: Podiatry

## 2017-07-07 ENCOUNTER — Ambulatory Visit (INDEPENDENT_AMBULATORY_CARE_PROVIDER_SITE_OTHER): Payer: BLUE CROSS/BLUE SHIELD | Admitting: Podiatry

## 2017-07-07 ENCOUNTER — Ambulatory Visit (INDEPENDENT_AMBULATORY_CARE_PROVIDER_SITE_OTHER): Payer: BLUE CROSS/BLUE SHIELD

## 2017-07-07 DIAGNOSIS — M2042 Other hammer toe(s) (acquired), left foot: Secondary | ICD-10-CM

## 2017-07-07 DIAGNOSIS — Z9889 Other specified postprocedural states: Secondary | ICD-10-CM

## 2017-07-07 NOTE — Progress Notes (Signed)
Subjective:   Patient ID: Victoria RingerMichelle L Malachi, female   DOB: 27 y.o.   MRN: [id number]   HPI Patient states doing great with left foot and feeling really good with minimal discomfort   ROS      Objective:  Physical Exam  Neurovascular status intact with patient's left foot healing well with wound edges well coapted hallux in rectus position fifth toe healing well     Assessment:  Doing very well post Austin osteotomy left arthroplasty fifth digit left     Plan:  Final x-rays reviewed allow patient to return to normal activity and patient is discharged and will be seen back as needed  X-ray indicates osteotomy is healing well fixation in place joint congruous

## 2017-08-08 DIAGNOSIS — E663 Overweight: Secondary | ICD-10-CM | POA: Diagnosis not present

## 2017-08-08 DIAGNOSIS — Z6826 Body mass index (BMI) 26.0-26.9, adult: Secondary | ICD-10-CM | POA: Diagnosis not present

## 2017-09-21 DIAGNOSIS — H9190 Unspecified hearing loss, unspecified ear: Secondary | ICD-10-CM | POA: Diagnosis not present

## 2017-10-11 ENCOUNTER — Encounter: Payer: Self-pay | Admitting: Podiatry

## 2017-10-11 ENCOUNTER — Ambulatory Visit (INDEPENDENT_AMBULATORY_CARE_PROVIDER_SITE_OTHER): Payer: BLUE CROSS/BLUE SHIELD

## 2017-10-11 ENCOUNTER — Ambulatory Visit (INDEPENDENT_AMBULATORY_CARE_PROVIDER_SITE_OTHER): Payer: BLUE CROSS/BLUE SHIELD | Admitting: Podiatry

## 2017-10-11 DIAGNOSIS — M2042 Other hammer toe(s) (acquired), left foot: Secondary | ICD-10-CM

## 2017-10-11 DIAGNOSIS — Z472 Encounter for removal of internal fixation device: Secondary | ICD-10-CM | POA: Diagnosis not present

## 2017-10-11 NOTE — Progress Notes (Signed)
Subjective:   Patient ID: Victoria RingerMichelle L [place]ma, female   DOB: 27 y.o.   MRN: [id number]   HPI Patient presents stating that the top of the left foot has become irritated and is hard for her to wear shoe gear.  She states that she is tried wider shoes and padding the area without relief and is very pleased with her bunion correction   ROS      Objective:  Physical Exam  Neurovascular status intact with prominence of the first metatarsal shaft left in the area of the proximal pin with inflammation pain in the area     Assessment:  Prominent pin position of the left dorsal foot first metatarsal     Plan:  H&P x-ray reviewed condition discussed and I do think pin removal is necessary.  I explained the procedure and risk and allowed her to sign consent form after extensive review she signed consent form understanding procedure risk and is scheduled for surgery to be done in the office for pin removal and I explained the postoperative process with the patient  X-ray indicates that the pain is directly in line with the proximal irritation that the patient is experiencing

## 2017-10-17 DIAGNOSIS — E663 Overweight: Secondary | ICD-10-CM | POA: Diagnosis not present

## 2017-10-17 DIAGNOSIS — Z6826 Body mass index (BMI) 26.0-26.9, adult: Secondary | ICD-10-CM | POA: Diagnosis not present

## 2017-10-19 ENCOUNTER — Ambulatory Visit (INDEPENDENT_AMBULATORY_CARE_PROVIDER_SITE_OTHER): Payer: BLUE CROSS/BLUE SHIELD | Admitting: Podiatry

## 2017-10-19 ENCOUNTER — Encounter: Payer: Self-pay | Admitting: Podiatry

## 2017-10-19 VITALS — BP 103/57 | HR 88 | Temp 98.7°F | Resp 16

## 2017-10-19 DIAGNOSIS — Z472 Encounter for removal of internal fixation device: Secondary | ICD-10-CM | POA: Diagnosis not present

## 2017-10-19 NOTE — Progress Notes (Signed)
Subjective:   Patient ID: Victoria Wade, female   DOB: 27 y.o.   MRN: [id number]   HPI Patient presents to get the painful pin out of the first metatarsal left foot   ROS      Objective:  Physical Exam  Neurovascular status intact with patient found to have a prominent pin left first metatarsal     Assessment:  Prominent pin position first metatarsal with pain     Plan:  Reviewed condition did proximal nerve block 60 mg like Marcaine mixture.  Patient was taken to the OR sterile prep applied to the foot and the ankle tourniquet was inflated to 2 mmHg after the foot was prepped and draped.  I then went ahead and using a 15 blade I made an incision in the first metatarsal dorsal took this down through subcutaneous tissue removing a small amount of pathological tissue that was thickened and I took it down to capsule made a small linear incision.  I sharply excised the capsule off the bone I exposed the pin and removed in toto and then flush the wound with copious Garamycin solution and did not note further pathology and sutured with 4-0 nylon.  Applied sterile dressing instructed on elevation and open toed shoes and reappoint 2 weeks suture removal or earlier if needed

## 2017-10-31 ENCOUNTER — Encounter: Payer: BLUE CROSS/BLUE SHIELD | Admitting: Podiatry

## 2017-11-09 ENCOUNTER — Ambulatory Visit (INDEPENDENT_AMBULATORY_CARE_PROVIDER_SITE_OTHER): Payer: Self-pay | Admitting: Podiatry

## 2017-11-09 ENCOUNTER — Ambulatory Visit (INDEPENDENT_AMBULATORY_CARE_PROVIDER_SITE_OTHER): Payer: BLUE CROSS/BLUE SHIELD

## 2017-11-09 DIAGNOSIS — M2042 Other hammer toe(s) (acquired), left foot: Secondary | ICD-10-CM

## 2017-11-09 NOTE — Progress Notes (Signed)
Subjective:   Patient ID: Victoria Wade, female   DOB: 27 y.o.   MRN: [id number]   HPI Patient states I am doing well and I am feeling slight prominence on the other one and he may have to have it removed in the future   ROS      Objective:  Physical Exam  Neurovascular status intact with excellent healing of the surgical site left first metatarsal with wound edges well coapted stitches in place in the right has slight prominence     Assessment:  Doing well post pin removal left with mild prominence of the right pin     Plan:  H&P condition reviewed stitches removed left with sterile dressing applied.  May remove the right if it starts to become symptomatic at one point in future  X-ray indicates satisfactory removal of pin with no indications of pathology

## 2018-01-17 DIAGNOSIS — E663 Overweight: Secondary | ICD-10-CM | POA: Diagnosis not present

## 2018-01-17 DIAGNOSIS — Z6825 Body mass index (BMI) 25.0-25.9, adult: Secondary | ICD-10-CM | POA: Diagnosis not present

## 2018-03-01 ENCOUNTER — Ambulatory Visit (INDEPENDENT_AMBULATORY_CARE_PROVIDER_SITE_OTHER): Payer: BLUE CROSS/BLUE SHIELD | Admitting: Podiatry

## 2018-03-01 ENCOUNTER — Encounter: Payer: Self-pay | Admitting: Podiatry

## 2018-03-01 ENCOUNTER — Ambulatory Visit (INDEPENDENT_AMBULATORY_CARE_PROVIDER_SITE_OTHER): Payer: BLUE CROSS/BLUE SHIELD

## 2018-03-01 DIAGNOSIS — Z472 Encounter for removal of internal fixation device: Secondary | ICD-10-CM | POA: Diagnosis not present

## 2018-03-01 DIAGNOSIS — T847XXA Infection and inflammatory reaction due to other internal orthopedic prosthetic devices, implants and grafts, initial encounter: Secondary | ICD-10-CM

## 2018-03-01 NOTE — Patient Instructions (Signed)
Pre-Operative Instructions  Congratulations, you have decided to take an important step towards improving your quality of life.  You can be assured that the doctors and staff at [facility] will be with you every step of the way.  Here are some important things you should know:  1. Plan to be at the surgery center/hospital at least 1 (one) hour prior to your scheduled time, unless otherwise directed by the surgical center/hospital staff.  You must have a responsible adult accompany you, remain during the surgery and drive you home.  Make sure you have directions to the surgical center/hospital to ensure you arrive on time. 2. If you are having surgery at [facility] or [facility], you will need a copy of your medical history and physical form from your family physician within one month prior to the date of surgery. We will give you a form for your primary physician to complete.  3. We make every effort to accommodate the date you request for surgery.  However, there are times where surgery dates or times have to be moved.  We will contact you as soon as possible if a change in schedule is required.   4. No aspirin/ibuprofen for one week before surgery.  If you are on aspirin, any non-steroidal anti-inflammatory medications (Mobic, Aleve, Ibuprofen) should not be taken seven (7) days prior to your surgery.  You make take Tylenol for pain prior to surgery.  5. Medications - If you are taking daily heart and blood pressure medications, seizure, reflux, allergy, asthma, anxiety, pain or diabetes medications, make sure you notify the surgery center/hospital before the day of surgery so they can tell you which medications you should take or avoid the day of surgery. 6. No food or drink after midnight the night before surgery unless directed otherwise by surgical center/hospital staff. 7. No alcoholic beverages 24-hours prior to surgery.  No smoking 24-hours prior or 24-hours after  surgery. 8. Wear loose pants or shorts. They should be loose enough to fit over bandages, boots, and casts. 9. Don't wear slip-on shoes. Sneakers are preferred. 10. Bring your boot with you to the surgery center/hospital.  Also bring crutches or a walker if your physician has prescribed it for you.  If you do not have this equipment, it will be provided for you after surgery. 11. If you have not been contacted by the surgery center/hospital by the day before your surgery, call to confirm the date and time of your surgery. 12. Leave-time from work may vary depending on the type of surgery you have.  Appropriate arrangements should be made prior to surgery with your employer. 13. Prescriptions will be provided immediately following surgery by your doctor.  Fill these as soon as possible after surgery and take the medication as directed. Pain medications will not be refilled on weekends and must be approved by the doctor. 14. Remove nail polish on the operative foot and avoid getting pedicures prior to surgery. 15. Wash the night before surgery.  The night before surgery wash the foot and leg well with water and the antibacterial soap provided. Be sure to pay special attention to beneath the toenails and in between the toes.  Wash for at least three (3) minutes. Rinse thoroughly with water and dry well with a towel.  Perform this wash unless told not to do so by your physician.  Enclosed: 1 Ice pack (please put in freezer the night before surgery)   1 Hibiclens skin cleaner     Pre-op instructions  If you have any questions regarding the instructions, please do not hesitate to call our office.  [place]: [address], [place], [place] [address] -- [phone]  [place]: [address] [place], [place] [address] -- [phone]  [place]: [address]  [place], [place] [address] -- [phone]  [place]: [address], [address], [place], [place] [address] -- [phone]  Website:  [email or web address] 

## 2018-03-01 NOTE — Progress Notes (Signed)
Subjective:   Patient ID: Victoria RingerMichelle L Lisa, female   DOB: 27 y.o.   MRN: [id number]   HPI Patient presents stating that she needs to have this pin taken out as it is gotten painful on her right foot and make shoe gear difficult   ROS      Objective:  Physical Exam  Neurovascular status intact with patient's right first metatarsal showing a prominence of the pin in the proximal portion that is painful     Assessment:  Abnormal pin position right proximal     Plan:  Reviewed condition and recommended removal of the pin and patient wants surgery.  I went ahead and allow her to read consent form for surgery explaining pin removal and patient is scheduled for outpatient surgery at this time and understands no guarantee as far success and is willing to accept risk.  Patient will be seen back for surgical procedure in the next 2 weeks and is encouraged to call with any questions  X-rays indicate that the pin is slightly prominent in the proximal portion

## 2018-03-15 ENCOUNTER — Ambulatory Visit (INDEPENDENT_AMBULATORY_CARE_PROVIDER_SITE_OTHER): Payer: BLUE CROSS/BLUE SHIELD | Admitting: Podiatry

## 2018-03-15 ENCOUNTER — Encounter: Payer: Self-pay | Admitting: Podiatry

## 2018-03-15 VITALS — BP 99/63 | HR 89 | Temp 98.6°F | Resp 16

## 2018-03-15 DIAGNOSIS — Z472 Encounter for removal of internal fixation device: Secondary | ICD-10-CM

## 2018-03-15 NOTE — Progress Notes (Signed)
Subjective:   Patient ID: Tivis RingerMichelle L Mcginniss, female   DOB: 27 y.o.   MRN: [id number]   HPI Patient presents to office today for removal of prominent pin first metatarsal right that is bothersome and make shoe gear difficult   ROS      Objective:  Physical Exam  Neurovascular status intact with prominent proximal pin with distal pin giving her no problems or pathology     Assessment:  Abnormal proximal pain in right first metatarsal     Plan:  Patient brought to the OR and placed in the supine position.  Patient was injected with a total of 60 mg Xylocaine Marcaine mixture and the patient's right foot was prepped and draped utilizing standard aseptic technique.  The foot was exsanguinated utilizing Ace wrap and the right ankle tourniquet was inflated to 210 mmHg and the following procedure was performed.  Attention was directed dorsal aspect right where an approximate 2 cm incision was made centered over the prominent pin position.  The incision was deepened through subcutaneous tissue with hemostasis being acquired as necessary and was further taken to capsule with a linear capsular incision performed.  The pin was identified freed from all surrounding soft tissue attachments and removed in toto.  The wound was flushed with copious nonsterile Garamycin solution and sutured with 5-0 nylon and sterile dressing applied to the right foot.  Patient had tourniquet released There was immediate and left the OR in satisfactory condition

## 2018-03-31 ENCOUNTER — Encounter: Payer: BLUE CROSS/BLUE SHIELD | Admitting: *Deleted

## 2018-03-31 ENCOUNTER — Ambulatory Visit: Payer: BLUE CROSS/BLUE SHIELD

## 2018-03-31 DIAGNOSIS — T847XXD Infection and inflammatory reaction due to other internal orthopedic prosthetic devices, implants and grafts, subsequent encounter: Secondary | ICD-10-CM

## 2018-03-31 NOTE — Progress Notes (Signed)
This encounter was created in error - please disregard.

## 2018-04-04 ENCOUNTER — Ambulatory Visit (INDEPENDENT_AMBULATORY_CARE_PROVIDER_SITE_OTHER): Payer: Self-pay | Admitting: Podiatry

## 2018-04-04 ENCOUNTER — Ambulatory Visit (INDEPENDENT_AMBULATORY_CARE_PROVIDER_SITE_OTHER): Payer: BLUE CROSS/BLUE SHIELD

## 2018-04-04 DIAGNOSIS — Z9889 Other specified postprocedural states: Secondary | ICD-10-CM

## 2018-04-04 DIAGNOSIS — Z472 Encounter for removal of internal fixation device: Secondary | ICD-10-CM

## 2018-04-04 NOTE — Progress Notes (Signed)
Patient is here today for follow-up appointment and suture removal.  Recent procedure performed on 03/15/2018: Removal fixation deep K wire/screw x2 right foot.  She says that she is not having any problems at this time, but is concerned about scarring and flexibility in her big toe joint.  All sutures were removed, surgical incision well coapted with no gapping noted.  There is no redness, no swelling, no drainage, no signs and symptoms of infection.  For scarring I advised her to utilize vitamin E oil, or Shea butter, or coconut oil and gently massage the scars.  We discussed and demonstrated range of motion exercises for her big toe joint and importance of walking heel-to-toe as normal as possible.  She is to follow-up as needed with any acute symptom changes.

## 2018-06-05 DIAGNOSIS — Z01419 Encounter for gynecological examination (general) (routine) without abnormal findings: Secondary | ICD-10-CM | POA: Diagnosis not present

## 2018-06-05 DIAGNOSIS — Z113 Encounter for screening for infections with a predominantly sexual mode of transmission: Secondary | ICD-10-CM | POA: Diagnosis not present

## 2018-06-05 DIAGNOSIS — Z118 Encounter for screening for other infectious and parasitic diseases: Secondary | ICD-10-CM | POA: Diagnosis not present

## 2018-06-05 DIAGNOSIS — Z114 Encounter for screening for human immunodeficiency virus [HIV]: Secondary | ICD-10-CM | POA: Diagnosis not present

## 2018-06-05 DIAGNOSIS — Z6827 Body mass index (BMI) 27.0-27.9, adult: Secondary | ICD-10-CM | POA: Diagnosis not present

## 2018-06-05 DIAGNOSIS — Z1159 Encounter for screening for other viral diseases: Secondary | ICD-10-CM | POA: Diagnosis not present

## 2018-06-15 ENCOUNTER — Ambulatory Visit: Payer: Self-pay

## 2018-06-15 ENCOUNTER — Encounter: Payer: Self-pay | Admitting: Family Medicine

## 2018-06-15 ENCOUNTER — Ambulatory Visit (INDEPENDENT_AMBULATORY_CARE_PROVIDER_SITE_OTHER): Payer: BLUE CROSS/BLUE SHIELD | Admitting: Family Medicine

## 2018-06-15 ENCOUNTER — Other Ambulatory Visit: Payer: Self-pay

## 2018-06-15 VITALS — BP 112/78 | HR 100 | Temp 98.4°F | Ht 65.0 in | Wt 165.1 lb

## 2018-06-15 DIAGNOSIS — R05 Cough: Secondary | ICD-10-CM

## 2018-06-15 DIAGNOSIS — Z8709 Personal history of other diseases of the respiratory system: Secondary | ICD-10-CM

## 2018-06-15 DIAGNOSIS — R059 Cough, unspecified: Secondary | ICD-10-CM

## 2018-06-15 DIAGNOSIS — J302 Other seasonal allergic rhinitis: Secondary | ICD-10-CM

## 2018-06-15 MED ORDER — PREDNISONE 10 MG PO TABS: ORAL_TABLET | ORAL | 0 refills | Status: DC

## 2018-06-15 MED ORDER — ALBUTEROL SULFATE HFA 108 (90 BASE) MCG/ACT IN AERS: 2.0000 | INHALATION_SPRAY | Freq: Four times a day (QID) | RESPIRATORY_TRACT | 0 refills | Status: DC | PRN

## 2018-06-15 NOTE — Patient Instructions (Signed)
It was a pleasure to meet you today!  Please choose either Allegra, Claritin, or Zyrtec for symptoms.  Prednisone has been sent to your pharmacy for symptoms, please take this medication with food and avoid direct sunlight when taking this medication.  Albuterol inhaler has been provided to use if needed.  If symptoms do not improve, worsen, or new symptoms develop, follow up with Dr. Jonny RuizJohn for further evaluation.   Allergic Rhinitis, Adult Allergic rhinitis is a reaction to allergens in the air. Allergens are tiny specks (particles) in the air that cause your body to have an allergic reaction. This condition cannot be passed from person to person (is not contagious). Allergic rhinitis cannot be cured, but it can be controlled. There are two types of allergic rhinitis:  Seasonal. This type is also called hay fever. It happens only during certain times of the year.  Perennial. This type can happen at any time of the year. What are the causes? This condition may be caused by:  Pollen from grasses, trees, and weeds.  House dust mites.  Pet dander.  Mold. What are the signs or symptoms? Symptoms of this condition include:  Sneezing.  Runny or stuffy nose (nasal congestion).  A lot of mucus in the back of the throat (postnasal drip).  Itchy nose.  Tearing of the eyes.  Trouble sleeping.  Being sleepy during day. How is this treated? There is no cure for this condition. You should avoid things that trigger your symptoms (allergens). Treatment can help to relieve symptoms. This may include:  Medicines that block allergy symptoms, such as antihistamines. These may be given as a shot, nasal spray, or pill.  Shots that are given until your body becomes less sensitive to the allergen (desensitization).  Stronger medicines, if all other treatments have not worked. Follow these instructions at home: Avoiding allergens   Find out what you are allergic to. Common allergens  include smoke, dust, and pollen.  Avoid them if you can. These are some of the things that you can do to avoid allergens: ? Replace carpet with wood, tile, or vinyl flooring. Carpet can trap dander and dust. ? Clean any mold found in the home. ? Do not smoke. Do not allow smoking in your home. ? Change your heating and air conditioning filter at least once a month. ? During allergy season:  Keep windows closed as much as you can. If possible, use air conditioning when there is a lot of pollen in the air.  Use a special filter for allergies with your furnace and air conditioner.  Plan outdoor activities when pollen counts are lowest. This is usually during the early morning or evening hours.  If you do go outdoors when pollen count is high, wear a special mask for people with allergies.  When you come indoors, take a shower and change your clothes before sitting on furniture or bedding. General instructions  Do not use fans in your home.  Do not hang clothes outside to dry.  Wear sunglasses to keep pollen out of your eyes.  Wash your hands right away after you touch household pets.  Take over-the-counter and prescription medicines only as told by your doctor.  Keep all follow-up visits as told by your doctor. This is important. Contact a doctor if:  You have a fever.  You have a cough that does not go away (is persistent).  You start to make whistling sounds when you breathe (wheeze).  Your symptoms do not get  better with treatment.  You have thick fluid coming from your nose.  You start to have nosebleeds. Get help right away if:  Your tongue or your lips are swollen.  You have trouble breathing.  You feel dizzy or you feel like you are going to pass out (faint).  You have cold sweats. Summary  Allergic rhinitis is a reaction to allergens in the air.  This condition may be caused by allergens. These include pollen, dust mites, pet dander, and mold.  Symptoms  include a runny, itchy nose, sneezing, or tearing eyes. You may also have trouble sleeping or feel sleepy during the day.  Treatment includes taking medicines and avoiding allergens. You may also get shots or take stronger medicines.  Get help if you have a fever or a cough that does not stop. Get help right away if you are short of breath. This information is not intended to replace advice given to you by your health care provider. Make sure you discuss any questions you have with your health care provider. Document Released: 07/22/2010 Document Revised: 10/11/2017 Document Reviewed: 10/11/2017 Elsevier Interactive Patient Education  2019 [employer]  Asthma, Adult  Asthma is a long-term (chronic) condition in which the airways get tight and narrow. The airways are the breathing passages that lead from the nose and mouth down into the lungs. A person with asthma will have times when symptoms get worse. These are called asthma attacks. They can cause coughing, whistling sounds when you breathe (wheezing), shortness of breath, and chest pain. They can make it hard to breathe. There is no cure for asthma, but medicines and lifestyle changes can help control it. There are many things that can bring on an asthma attack or make asthma symptoms worse (triggers). Common triggers include:  Mold.  Dust.  Cigarette smoke.  Cockroaches.  Things that can cause allergy symptoms (allergens). These include animal skin flakes (dander) and pollen from trees or grass.  Things that pollute the air. These may include household cleaners, wood smoke, smog, or chemical odors.  Cold air, weather changes, and wind.  Crying or laughing hard.  Stress.  Certain medicines or drugs.  Certain foods such as dried fruit, potato chips, and grape juice.  Infections, such as a cold or the flu.  Certain medical conditions or diseases.  Exercise or tiring activities. Asthma may be treated with medicines and by  staying away from the things that cause asthma attacks. Types of medicines may include:  Controller medicines. These help prevent asthma symptoms. They are usually taken every day.  Fast-acting reliever or rescue medicines. These quickly relieve asthma symptoms. They are used as needed and provide short-term relief.  Allergy medicines if your attacks are brought on by allergens.  Medicines to help control the body's defense (immune) system. Follow these instructions at home: Avoiding triggers in your home  Change your heating and air conditioning filter often.  Limit your use of fireplaces and wood stoves.  Get rid of pests (such as roaches and mice) and their droppings.  Throw away plants if you see mold on them.  Clean your floors. Dust regularly. Use cleaning products that do not smell.  Have someone vacuum when you are not home. Use a vacuum cleaner with a HEPA filter if possible.  Replace carpet with wood, tile, or vinyl flooring. Carpet can trap animal skin flakes and dust.  Use allergy-proof pillows, mattress covers, and box spring covers.  Wash bed sheets and blankets every week in  hot water. Dry them in a dryer.  Keep your bedroom free of any triggers.  Avoid pets and keep windows closed when things that cause allergy symptoms are in the air.  Use blankets that are made of polyester or cotton.  Clean bathrooms and kitchens with bleach. If possible, have someone repaint the walls in these rooms with mold-resistant paint. Keep out of the rooms that are being cleaned and painted.  Wash your hands often with soap and water. If soap and water are not available, use hand sanitizer.  Do not allow anyone to smoke in your home. General instructions  Take over-the-counter and prescription medicines only as told by your doctor. ? Talk with your doctor if you have questions about how or when to take your medicines. ? Make note if you need to use your medicines more often  than usual.  Do not use any products that contain nicotine or tobacco, such as cigarettes and e-cigarettes. If you need help quitting, ask your doctor.  Stay away from secondhand smoke.  Avoid doing things outdoors when allergen counts are high and when air quality is low.  Wear a ski mask when doing outdoor activities in the winter. The mask should cover your nose and mouth. Exercise indoors on cold days if you can.  Warm up before you exercise. Take time to cool down after exercise.  Use a peak flow meter as told by your doctor. A peak flow meter is a tool that measures how well the lungs are working.  Keep track of the peak flow meter's readings. Write them down.  Follow your asthma action plan. This is a written plan for taking care of your asthma and treating your attacks.  Make sure you get all the shots (vaccines) that your doctor recommends. Ask your doctor about a flu shot and a pneumonia shot.  Keep all follow-up visits as told by your doctor. This is important. Contact a doctor if:  You have wheezing, shortness of breath, or a cough even while taking medicine to prevent attacks.  The mucus you cough up (sputum) is thicker than usual.  The mucus you cough up changes from clear or white to yellow, green, gray, or bloody.  You have problems from the medicine you are taking, such as: ? A rash. ? Itching. ? Swelling. ? Trouble breathing.  You need reliever medicines more than 2-3 times a week.  Your peak flow reading is still at 50-79% of your personal best after following the action plan for 1 hour.  You have a fever. Get help right away if:  You seem to be worse and are not responding to medicine during an asthma attack.  You are short of breath even at rest.  You get short of breath when doing very little activity.  You have trouble eating, drinking, or talking.  You have chest pain or tightness.  You have a fast heartbeat.  Your lips or fingernails  start to turn blue.  You are light-headed or dizzy, or you faint.  Your peak flow is less than 50% of your personal best.  You feel too tired to breathe normally. Summary  Asthma is a long-term (chronic) condition in which the airways get tight and narrow. An asthma attack can make it hard to breathe.  Asthma cannot be cured, but medicines and lifestyle changes can help control it.  Make sure you understand how to avoid triggers and how and when to use your medicines. This information is not  intended to replace advice given to you by your health care provider. Make sure you discuss any questions you have with your health care provider. Document Released: 09/08/2007 Document Revised: 04/26/2016 Document Reviewed: 04/26/2016 Elsevier Interactive Patient Education  2019 [employer].

## 2018-06-15 NOTE — Progress Notes (Signed)
Subjective:    Patient ID: Victoria Wade, female    DOB: November 08, 1990, 28 y.o.   MRN: [id number]  HPI  Victoria Wade is a 28 year old female who presents today with cough that occurs at night. Cough is nonproductive and she reports intermittent wheezing that occurs in the evening. She reports one episode of SOB when wheezing and cough were present. She describes this as mild SOB.  Associated watery eyes and nasal congestion that occurred after working in the yard. She denies chest pain, palpitation, dizziness, syncope, HA, body aches, orthopnea, fever, chills, sweats. Treatment with Claritin has provided some benefit. Previously she has used an albuterol inhaler and stated that she used this mainly when the "weather changes". She reports that she has moved and is unable to locate her inhaler. History of asthma present.  She does not smoke. She denies pregnancy and LMP was 26 days ago. She is due for next menstrual cycle   Review of Systems  Constitutional: Negative for chills, fatigue and fever.  HENT: Positive for postnasal drip. Negative for congestion, sinus pressure, sinus pain, sneezing, sore throat and tinnitus.   Eyes:       Watery eyes  Respiratory: Positive for cough, shortness of breath and wheezing.   Cardiovascular: Negative for chest pain and palpitations.  Gastrointestinal: Negative for abdominal pain, diarrhea, nausea and vomiting.  Skin: Negative for rash.  Neurological: Negative for dizziness, weakness, light-headedness and headaches.   Past Medical History:  Diagnosis Date  . ADHD 10/30/2008  . ANEMIA-IRON DEFICIENCY 10/30/2008  . ASTHMA 10/30/2008  . BACK PAIN 06/12/2009  . TONSILLITIS, CHRONIC 06/12/2009     Social History   Socioeconomic History  . Marital status: Single    Spouse name: Not on file  . Number of children: Not on file  . Years of education: Not on file  . Highest education level: Not on file  Occupational History  . Not on file  Social  Needs  . Financial resource strain: Not on file  . Food insecurity:    Worry: Not on file    Inability: Not on file  . Transportation needs:    Medical: Not on file    Non-medical: Not on file  Tobacco Use  . Smoking status: Never Smoker  . Smokeless tobacco: Never Used  . Tobacco comment: No Children  Substance and Sexual Activity  . Alcohol use: No  . Drug use: No  . Sexual activity: Not on file  Lifestyle  . Physical activity:    Days per week: Not on file    Minutes per session: Not on file  . Stress: Not on file  Relationships  . Social connections:    Talks on phone: Not on file    Gets together: Not on file    Attends religious service: Not on file    Active member of club or organization: Not on file    Attends meetings of clubs or organizations: Not on file    Relationship status: Not on file  . Intimate partner violence:    Fear of current or ex partner: Not on file    Emotionally abused: Not on file    Physically abused: Not on file    Forced sexual activity: Not on file  Other Topics Concern  . Not on file  Social History Narrative  . Not on file    Past Surgical History:  Procedure Laterality Date  . none      Family History  Problem Relation Age of Onset  . Prostate cancer Maternal Uncle   . Diabetes Other        both side of family  . Hypertension Other        both side of family  . Stroke Other        Aunt  . Cancer Other        Uncle - Prostate    Allergies  Allergen Reactions  . Latex Other (See Comments)  . Pineapple     Pt stated, "I get swelling in my mouth"    Current Outpatient Medications on File Prior to Visit  Medication Sig Dispense Refill  . fluconazole (DIFLUCAN) 150 MG tablet TAKE 1 TABLET BY MOUTH NOW AND AGAIN ON DAY 4    . JUNEL FE 1/20 1-20 MG-MCG tablet Take 1 tablet by mouth daily.    . norethindrone-ethinyl estradiol (MICROGESTIN,JUNEL,LOESTRIN) 1-20 MG-MCG tablet Take 1 tablet by mouth daily.    . phentermine  (ADIPEX-P) 37.5 MG tablet Take 37.5 mg by mouth every morning.    . phentermine 37.5 MG capsule Take 1 capsule (37.5 mg total) by mouth every morning. (Patient not taking: Reported on 06/15/2018) 30 capsule 2   No current facility-administered medications on file prior to visit.     BP 112/78 (BP Location: Right Arm, Patient Position: Sitting, Cuff Size: Normal)   Pulse 100   Temp 98.4 F (36.9 C) (Oral)   Ht 5\' 5"  (1.651 m)   Wt 165 lb 1.3 oz (74.9 kg)   SpO2 98%   BMI 27.47 kg/m       Objective:   Physical Exam Constitutional:      Appearance: Normal appearance.  HENT:     Right Ear: Tympanic membrane normal.     Left Ear: Tympanic membrane normal.     Nose:     Comments: Turbinates erythematous    Mouth/Throat:     Mouth: Mucous membranes are moist.     Pharynx: Oropharynx is clear.     Comments: Post nasal drip present Eyes:     General: No scleral icterus.    Pupils: Pupils are equal, round, and reactive to light.  Cardiovascular:     Rate and Rhythm: Normal rate and regular rhythm.     Pulses: Normal pulses.  Pulmonary:     Effort: Pulmonary effort is normal.     Breath sounds: Normal breath sounds. No wheezing or rales.  Skin:    General: Skin is warm and dry.  Neurological:     Mental Status: She is alert.  Psychiatric:        Mood and Affect: Mood normal.        Behavior: Behavior normal.        Thought Content: Thought content normal.        Judgment: Judgment normal.           Assessment & Plan:  1. Cough Exam and history are most consistent with allergic rhinitis and mild intermittent asthma symptoms. Advised use of Allegra, Claritin, or Zyrtec, provided short course of  Prednisone due to history of wheezing reported, and albuterol to use if needed. No wheezing present today.  - albuterol (PROVENTIL HFA;VENTOLIN HFA) 108 (90 Base) MCG/ACT inhaler; Inhale 2 puffs into the lungs every 6 (six) hours as needed for wheezing or shortness of breath.   Dispense: 1 Inhaler; Refill: 0 - predniSONE (DELTASONE) 10 MG tablet; Take 4 tablets once daily for 2 days, 3 tabs daily for 2  days, 2 tabs daily for 2 days, 1 tab daily for 2 days.  Dispense: 20 tablet; Refill: 0  2. Seasonal allergic rhinitis, unspecified trigger Allegra, Claritin, or Zyrtec for symptoms.  Advised aggressive treatment of allergies and avoidance of triggers.   3. History of asthma No symptoms have been present for "quite some time" per patient. Provided albuterol inhaler to use if needed. Short course of prednisone provided due to history of wheezing reported by patient.  Discussed appropriate use of rescue inhaler and she will follow up with PCP if she is using this more often or on a daily basis. We reviewed options for daily treatment if needed. Advised follow up if symptoms are not improving.She voiced understanding and agreed with plan.  Roddie McJulia Fate Caster, FNP-C

## 2018-06-15 NOTE — Telephone Encounter (Signed)
Pt with h/o asthma, c/o mild SOB at rest and stated she gets SOB climbing stairs. Pt stated she feels like it is harder to get a good breath in. Pt denies fever, international travel or productive cough. Pt stated she had similar symptoms 3 years ago and was put on inhalers and steroids. Pt fells it is the change of season and the weather.  Care advice given and pt verbalized understanding. Pt given appt today with Roddie Mc FNP today.   Reason for Disposition . [1] MILD difficulty breathing (e.g., minimal/no SOB at rest, SOB with walking, pulse <100) AND [2] NEW-onset or WORSE than normal  Answer Assessment - Initial Assessment Questions 1. RESPIRATORY STATUS: "Describe your breathing?" (e.g., wheezing, shortness of breath, unable to speak, severe coughing)      SOB with exertion- when tries to inhale but its difficult to take a deep breath 2. ONSET: "When did this breathing problem begin?"      Saturday at night time SOB when laying flat, coughing increase 3. PATTERN "Does the difficult breathing come and go, or has it been constant since it started?"      Come and goes more with laying down - trying to prevent from talking to prevent getting OOB 4. SEVERITY: "How bad is your breathing?" (e.g., mild, moderate, severe)    - MILD: No SOB at rest, mild SOB with walking, speaks normally in sentences, can lay down, no retractions, pulse < 100.    - MODERATE: SOB at rest, SOB with minimal exertion and prefers to sit, cannot lie down flat, speaks in phrases, mild retractions, audible wheezing, pulse 100-120.    - SEVERE: Very SOB at rest, speaks in single words, struggling to breathe, sitting hunched forward, retractions, pulse > 120      Moderate SOB with sitting 5. RECURRENT SYMPTOM: "Have you had difficulty breathing before?" If so, ask: "When was the last time?" and "What happened that time?"      Yes- last time was 3 years ago- inhaler and allergy medication 6. CARDIAC HISTORY: "Do you  have any history of heart disease?" (e.g., heart attack, angina, bypass surgery, angioplasty)      no 7. LUNG HISTORY: "Do you have any history of lung disease?"  (e.g., pulmonary embolus, asthma, emphysema)     Asthma dx as a teenager 8. CAUSE: "What do you think is causing the breathing problem?"      Change of weather and season and allergies 9. OTHER SYMPTOMS: "Do you have any other symptoms? (e.g., dizziness, runny nose, cough, chest pain, fever)     Cough, dizziness when quickly moving from a supine to a sitting position, chest tight 10. PREGNANCY: "Is there any chance you are pregnant?" "When was your last menstrual period?"       No LMP: 05/19/18 11. TRAVEL: "Have you traveled out of the country in the last month?" (e.g., travel history, exposures)       No travel  Protocols used: BREATHING DIFFICULTY-A-AH

## 2018-06-28 DIAGNOSIS — N898 Other specified noninflammatory disorders of vagina: Secondary | ICD-10-CM | POA: Diagnosis not present

## 2018-06-28 DIAGNOSIS — R8290 Unspecified abnormal findings in urine: Secondary | ICD-10-CM | POA: Diagnosis not present

## 2018-07-05 ENCOUNTER — Other Ambulatory Visit: Payer: Self-pay | Admitting: Family Medicine

## 2018-07-05 DIAGNOSIS — R05 Cough: Secondary | ICD-10-CM

## 2018-07-05 DIAGNOSIS — R059 Cough, unspecified: Secondary | ICD-10-CM

## 2018-09-12 DIAGNOSIS — Z3009 Encounter for other general counseling and advice on contraception: Secondary | ICD-10-CM | POA: Diagnosis not present

## 2018-11-20 DIAGNOSIS — M542 Cervicalgia: Secondary | ICD-10-CM | POA: Diagnosis not present

## 2018-11-20 DIAGNOSIS — M9901 Segmental and somatic dysfunction of cervical region: Secondary | ICD-10-CM | POA: Diagnosis not present

## 2018-11-20 DIAGNOSIS — M545 Low back pain: Secondary | ICD-10-CM | POA: Diagnosis not present

## 2018-11-20 DIAGNOSIS — M9903 Segmental and somatic dysfunction of lumbar region: Secondary | ICD-10-CM | POA: Diagnosis not present

## 2018-12-28 DIAGNOSIS — E663 Overweight: Secondary | ICD-10-CM | POA: Diagnosis not present

## 2018-12-28 DIAGNOSIS — Z6829 Body mass index (BMI) 29.0-29.9, adult: Secondary | ICD-10-CM | POA: Diagnosis not present

## 2019-01-18 DIAGNOSIS — N390 Urinary tract infection, site not specified: Secondary | ICD-10-CM | POA: Diagnosis not present

## 2019-01-18 DIAGNOSIS — S339XXA Sprain of unspecified parts of lumbar spine and pelvis, initial encounter: Secondary | ICD-10-CM | POA: Diagnosis not present

## 2019-01-25 ENCOUNTER — Encounter: Payer: Self-pay | Admitting: Internal Medicine

## 2019-01-25 ENCOUNTER — Ambulatory Visit (INDEPENDENT_AMBULATORY_CARE_PROVIDER_SITE_OTHER): Payer: BC Managed Care – PPO | Admitting: Internal Medicine

## 2019-01-25 ENCOUNTER — Other Ambulatory Visit: Payer: Self-pay

## 2019-01-25 ENCOUNTER — Telehealth: Payer: Self-pay | Admitting: Internal Medicine

## 2019-01-25 ENCOUNTER — Other Ambulatory Visit (INDEPENDENT_AMBULATORY_CARE_PROVIDER_SITE_OTHER): Payer: BC Managed Care – PPO

## 2019-01-25 VITALS — BP 114/76 | HR 78 | Temp 98.4°F | Ht 65.0 in | Wt 182.0 lb

## 2019-01-25 DIAGNOSIS — R7302 Impaired glucose tolerance (oral): Secondary | ICD-10-CM

## 2019-01-25 DIAGNOSIS — Z0001 Encounter for general adult medical examination with abnormal findings: Secondary | ICD-10-CM

## 2019-01-25 DIAGNOSIS — M545 Low back pain, unspecified: Secondary | ICD-10-CM | POA: Insufficient documentation

## 2019-01-25 DIAGNOSIS — R300 Dysuria: Secondary | ICD-10-CM

## 2019-01-25 DIAGNOSIS — J4530 Mild persistent asthma, uncomplicated: Secondary | ICD-10-CM | POA: Diagnosis not present

## 2019-01-25 DIAGNOSIS — Z0000 Encounter for general adult medical examination without abnormal findings: Secondary | ICD-10-CM | POA: Diagnosis not present

## 2019-01-25 LAB — BASIC METABOLIC PANEL
BUN: 12 mg/dL (ref 6–23)
CO2: 26 mEq/L (ref 19–32)
Calcium: 9 mg/dL (ref 8.4–10.5)
Chloride: 104 mEq/L (ref 96–112)
Creatinine, Ser: 0.82 mg/dL (ref 0.40–1.20)
GFR: 99.73 mL/min (ref >60.00)
Glucose, Bld: 87 mg/dL (ref 70–99)
Potassium: 3.8 mEq/L (ref 3.5–5.1)
Sodium: 136 mEq/L (ref 135–145)

## 2019-01-25 LAB — HEPATIC FUNCTION PANEL
ALT: 18 U/L (ref 0–35)
AST: 20 U/L (ref 0–37)
Albumin: 4.1 g/dL (ref 3.5–5.2)
Alkaline Phosphatase: 48 U/L (ref 39–117)
Bilirubin, Direct: 0 mg/dL (ref 0.0–0.3)
Total Bilirubin: 0.4 mg/dL (ref 0.2–1.2)
Total Protein: 7.4 g/dL (ref 6.0–8.3)

## 2019-01-25 LAB — LIPID PANEL
Cholesterol: 165 mg/dL (ref 0–200)
HDL: 43.4 mg/dL (ref >39.00)
LDL Cholesterol: 109 mg/dL — ABNORMAL HIGH (ref 0–99)
NonHDL: 121.98
Total CHOL/HDL Ratio: 4
Triglycerides: 67 mg/dL (ref 0.0–149.0)
VLDL: 13.4 mg/dL (ref 0.0–40.0)

## 2019-01-25 LAB — URINALYSIS, ROUTINE W REFLEX MICROSCOPIC
Bilirubin Urine: NEGATIVE
Ketones, ur: NEGATIVE
Leukocytes,Ua: NEGATIVE
Nitrite: NEGATIVE
Specific Gravity, Urine: <=1.005 — AB (ref 1.000–1.030)
Total Protein, Urine: NEGATIVE
Urine Glucose: NEGATIVE
Urobilinogen, UA: 0.2 (ref 0.0–1.0)
pH: 6 (ref 5.0–8.0)

## 2019-01-25 LAB — CBC WITH DIFFERENTIAL/PLATELET
Basophils Absolute: 0 10*3/uL (ref 0.0–0.1)
Basophils Relative: 0.4 % (ref 0.0–3.0)
Eosinophils Absolute: 0.1 10*3/uL (ref 0.0–0.7)
Eosinophils Relative: 1.7 % (ref 0.0–5.0)
HCT: 38.6 % (ref 36.0–46.0)
Hemoglobin: 12.8 g/dL (ref 12.0–15.0)
Lymphocytes Relative: 29.2 % (ref 12.0–46.0)
Lymphs Abs: 1.8 10*3/uL (ref 0.7–4.0)
MCHC: 33.2 g/dL (ref 30.0–36.0)
MCV: 89.7 fl (ref 78.0–100.0)
Monocytes Absolute: 0.5 10*3/uL (ref 0.1–1.0)
Monocytes Relative: 7.6 % (ref 3.0–12.0)
Neutro Abs: 3.8 10*3/uL (ref 1.4–7.7)
Neutrophils Relative %: 61.1 % (ref 43.0–77.0)
Platelets: 248 10*3/uL (ref 150.0–400.0)
RBC: 4.3 Mil/uL (ref 3.87–5.11)
RDW: 12.9 % (ref 11.5–15.5)
WBC: 6.2 10*3/uL (ref 4.0–10.5)

## 2019-01-25 LAB — TSH: TSH: 2.21 u[IU]/mL (ref 0.35–4.50)

## 2019-01-25 MED ORDER — NITROFURANTOIN MONOHYD MACRO 100 MG PO CAPS: 100.0000 mg | ORAL_CAPSULE | Freq: Two times a day (BID) | ORAL | 0 refills | Status: DC

## 2019-01-25 MED ORDER — NAPROXEN 500 MG PO TABS: 500.0000 mg | ORAL_TABLET | Freq: Two times a day (BID) | ORAL | 2 refills | Status: DC

## 2019-01-25 MED ORDER — CYCLOBENZAPRINE HCL 5 MG PO TABS: 5.0000 mg | ORAL_TABLET | Freq: Three times a day (TID) | ORAL | 1 refills | Status: DC | PRN

## 2019-01-25 NOTE — Telephone Encounter (Signed)
Done erx 

## 2019-01-25 NOTE — Patient Instructions (Signed)
Please take all new medication as prescribed - the antibiotic, naproxyn, and muscle relaxer as needed  Please continue all other medications as before, and refills have been done if requested.  Please have the pharmacy call with any other refills you may need.  Please continue your efforts at being more active, low cholesterol diet, and weight control.  You are otherwise up to date with prevention measures today.  Please keep your appointments with your specialists as you may have planned  Please go to the LAB in the Basement (turn left off the elevator) for the tests to be done today  You will be contacted by phone if any changes need to be made immediately.  Otherwise, you will receive a letter about your results with an explanation, but please check with MyChart first.  Please remember to sign up for MyChart if you have not done so, as this will be important to you in the future with finding out test results, communicating by private email, and scheduling acute appointments online when needed.  Please return in 1 year for your yearly visit, or sooner if needed

## 2019-01-25 NOTE — Telephone Encounter (Signed)
Patient calling and states that the pharmacy advised her that they did not receive the naproxen (NAPROSYN) 500 MG tablet  Prescription. Would like to know if this could be sent to the pharmacy again? Please advise   [id number] - [place], [place] [address]

## 2019-01-25 NOTE — Assessment & Plan Note (Signed)

## 2019-01-25 NOTE — Assessment & Plan Note (Addendum)
Etiology unclaer, to complete a macrobid course with urine study f/u today  In addition to the time spent performing CPE, I spent an additional 25 minutes face to face,in which greater than 50% of this time was spent in counseling and coordination of care for patient's acute illness as documented, including the differential dx, treatment, further evaluation and other management of  Dysuria, low back pain, hyperglycemia, asthma

## 2019-01-25 NOTE — Assessment & Plan Note (Signed)
C/w msk strain, for flexeril prn 

## 2019-01-25 NOTE — Assessment & Plan Note (Signed)
stable overall by history and exam, recent data reviewed with pt, and pt to continue medical treatment as before,  to f/u any worsening symptoms or concerns  

## 2019-01-25 NOTE — Progress Notes (Signed)
Subjective:    Patient ID: Victoria Wade, female    DOB: 1990/10/23, 28 y.o.   MRN: [id number]  HPI  Here for wellness and f/u;  Overall doing ok;  Pt denies Chest pain, worsening SOB, DOE, wheezing, orthopnea, PND, worsening LE edema, palpitations, dizziness or syncope.  Pt denies neurological change such as new headache, facial or extremity weakness.  Pt denies polydipsia, polyuria, or low sugar symptoms. Pt states overall good compliance with treatment and medications, good tolerability, and has been trying to follow appropriate diet.  Pt denies worsening depressive symptoms, suicidal ideation or panic. No fever, night sweats, wt loss, loss of appetite, or other constitutional symptoms.  Pt states good ability with ADL's, has low fall risk, home safety reviewed and adequate, no other significant changes in hearing or vision, and only occasionally active with exercise. Also here with back pain with radiation to the right pelvic area, tx for right pyelonephritis, tx with antibx for 5 days and referred back to PCP for f/u; also given muscle relaxer b/c did have a fall recently but did not help.   Also c/o urinary symptoms such as dysuria, frequency, but no urgency, flank pain, hematuria or n/v, fever, chills.  Now s/p 5 days macrobid and improved but still persists.   Past Medical History:  Diagnosis Date   ADHD 10/30/2008   ANEMIA-IRON DEFICIENCY 10/30/2008   ASTHMA 10/30/2008   BACK PAIN 06/12/2009   TONSILLITIS, CHRONIC 06/12/2009   Past Surgical History:  Procedure Laterality Date   none      reports that she has never smoked. She has never used smokeless tobacco. She reports that she does not drink alcohol or use drugs. family history includes Cancer in an other family member; Diabetes in an other family member; Hypertension in an other family member; Prostate cancer in her maternal uncle; Stroke in an other family member. Allergies  Allergen Reactions   Latex Other (See  Comments)   Pineapple     Pt stated, "I get swelling in my mouth"   Current Outpatient Medications on File Prior to Visit  Medication Sig Dispense Refill   albuterol (PROVENTIL HFA;VENTOLIN HFA) 108 (90 Base) MCG/ACT inhaler Inhale 2 puffs into the lungs every 6 (six) hours as needed for wheezing or shortness of breath. 1 Inhaler 0   JUNEL FE 1/20 1-20 MG-MCG tablet Take 1 tablet by mouth daily.     norethindrone-ethinyl estradiol (MICROGESTIN,JUNEL,LOESTRIN) 1-20 MG-MCG tablet Take 1 tablet by mouth daily.     phentermine 37.5 MG capsule Take 1 capsule (37.5 mg total) by mouth every morning. 30 capsule 2   No current facility-administered medications on file prior to visit.    Review of Systems Constitutional: Negative for other unusual diaphoresis, sweats, appetite or weight changes HENT: Negative for other worsening hearing loss, ear pain, facial swelling, mouth sores or neck stiffness.   Eyes: Negative for other worsening pain, redness or other visual disturbance.  Respiratory: Negative for other stridor or swelling Cardiovascular: Negative for other palpitations or other chest pain  Gastrointestinal: Negative for worsening diarrhea or loose stools, blood in stool, distention or other pain Genitourinary: Negative for hematuria, flank pain or other change in urine volume.  Musculoskeletal: Negative for myalgias or other joint swelling.  Skin: Negative for other color change, or other wound or worsening drainage.  Neurological: Negative for other syncope or numbness. Hematological: Negative for other adenopathy or swelling Psychiatric/Behavioral: Negative for hallucinations, other worsening agitation, SI, self-injury, or new decreased concentration.  All otherwise neg per pt    Objective:   Physical Exam BP 114/76 (BP Location: Left Arm, Patient Position: Sitting, Cuff Size: Normal)    Pulse 78    Temp 98.4 F (36.9 C) (Oral)    Ht 5\' 5"  (1.651 m)    Wt 182 lb (82.6 kg)    SpO2  98%    BMI 30.29 kg/m  VS noted,  Constitutional: Pt is oriented to person, place, and time. Appears well-developed and well-nourished, in no significant distress and comfortable Head: Normocephalic and atraumatic  Eyes: Conjunctivae and EOM are normal. Pupils are equal, round, and reactive to light Right Ear: External ear normal without discharge Left Ear: External ear normal without discharge Nose: Nose without discharge or deformity Mouth/Throat: Oropharynx is without other ulcerations and moist  Neck: Normal range of motion. Neck supple. No JVD present. No tracheal deviation present or significant neck LA or mass Cardiovascular: Normal rate, regular rhythm, normal heart sounds and intact distal pulses.   Pulmonary/Chest: WOB normal and breath sounds without rales or wheezing  Abdominal: Soft. Bowel sounds are normal. NT. No HSM  Musculoskeletal: Normal range of motion. Exhibits no edema, has bilateral lumbar paravertebral tender spasm Lymphadenopathy: Has no other cervical adenopathy.  Neurological: Pt is alert and oriented to person, place, and time. Pt has normal reflexes. No cranial nerve deficit. Motor grossly intact, Gait intact Skin: Skin is warm and dry. No rash noted or new ulcerations Psychiatric:  Has normal mood and affect. Behavior is normal without agitation\ All otherwise neg per pt  Lab Results  Component Value Date   WBC 6.2 01/25/2019   HGB 12.8 01/25/2019   HCT 38.6 01/25/2019   PLT 248.0 01/25/2019   GLUCOSE 87 01/25/2019   CHOL 165 01/25/2019   TRIG 67.0 01/25/2019   HDL 43.40 01/25/2019   LDLCALC 109 (H) 01/25/2019   ALT 18 01/25/2019   AST 20 01/25/2019   NA 136 01/25/2019   K 3.8 01/25/2019   CL 104 01/25/2019   CREATININE 0.82 01/25/2019   BUN 12 01/25/2019   CO2 26 01/25/2019   TSH 2.21 01/25/2019   HGBA1C 5.5 03/11/2015       Assessment & Plan:

## 2019-01-26 LAB — URINE CULTURE
MICRO NUMBER:: 1018871
Result:: NO GROWTH
SPECIMEN QUALITY:: ADEQUATE

## 2019-02-10 ENCOUNTER — Ambulatory Visit (INDEPENDENT_AMBULATORY_CARE_PROVIDER_SITE_OTHER): Payer: BC Managed Care – PPO | Admitting: Family Medicine

## 2019-02-10 ENCOUNTER — Other Ambulatory Visit: Payer: Self-pay

## 2019-02-10 ENCOUNTER — Encounter: Payer: Self-pay | Admitting: Internal Medicine

## 2019-02-10 ENCOUNTER — Encounter: Payer: Self-pay | Admitting: Family Medicine

## 2019-02-10 DIAGNOSIS — J069 Acute upper respiratory infection, unspecified: Secondary | ICD-10-CM | POA: Diagnosis not present

## 2019-02-10 DIAGNOSIS — R739 Hyperglycemia, unspecified: Secondary | ICD-10-CM

## 2019-02-10 DIAGNOSIS — Z20822 Contact with and (suspected) exposure to covid-19: Secondary | ICD-10-CM

## 2019-02-10 NOTE — Progress Notes (Signed)
I have discussed the procedure for the virtual visit with the patient who has given consent to proceed with assessment and treatment.   Pt unable to obtain vitals.   Jessica L Brodmerkel, CMA     

## 2019-02-10 NOTE — Progress Notes (Signed)
Virtual Visit via Video   I connected with patient on 02/10/19 at  9:00 AM EST by a video enabled telemedicine application and verified that I am speaking with the correct person using two identifiers.  Location patient: Home Location provider: Acupuncturist, Office Persons participating in the virtual visit: Patient, Provider, [place] (Jess B)  I discussed the limitations of evaluation and management by telemedicine and the availability of in person appointments. The patient expressed understanding and agreed to proceed.  Subjective:   HPI:   Sore throat- woke yesterday w/ fever, chills, muscle aches, and sore throat.  + swollen glands.  Reports feeling better today.  No fever.  Tm 99.7 yesterday.  + nasal congestion and PND.  Mild sinus pressure under eyes.  No known sick contacts.  Pt reports taking 'high precautions' due to aunt in chemo.  Pt reports this feels different than her recurrent tonsillitis.  sxs started suddenly yesterday.    ROS:   See pertinent positives and negatives per HPI.  Patient Active Problem List   Diagnosis Date Noted  . Dysuria 01/25/2019  . Low back pain 01/25/2019  . Allergic rhinitis 12/07/2016  . Cough 12/07/2016  . Wheezing 12/07/2016  . Obesity (BMI 30-39.9) 06/03/2016  . Headache 08/23/2015  . Head trauma 08/22/2015  . Syncope 08/22/2015  . Large breasts 03/15/2014  . Encounter for well adult exam with abnormal findings 02/11/2011  . Impaired glucose tolerance 02/11/2011  . Migraine 02/11/2011  . TONSILLITIS, CHRONIC 06/12/2009  . BACK PAIN 06/12/2009  . ANEMIA-IRON DEFICIENCY 10/30/2008  . ADHD 10/30/2008  . Mild persistent asthma 10/30/2008  . MENORRHAGIA 10/30/2008    Social History   Tobacco Use  . Smoking status: Never Smoker  . Smokeless tobacco: Never Used  . Tobacco comment: No Children  Substance Use Topics  . Alcohol use: No    Current Outpatient Medications:  .  JUNEL FE 1/20 1-20 MG-MCG tablet, Take 1 tablet  by mouth daily., Disp: , Rfl:  .  phentermine 37.5 MG capsule, Take 1 capsule (37.5 mg total) by mouth every morning., Disp: 30 capsule, Rfl: 2 .  albuterol (PROVENTIL HFA;VENTOLIN HFA) 108 (90 Base) MCG/ACT inhaler, Inhale 2 puffs into the lungs every 6 (six) hours as needed for wheezing or shortness of breath. (Patient not taking: Reported on 02/10/2019), Disp: 1 Inhaler, Rfl: 0 .  cyclobenzaprine (FLEXERIL) 5 MG tablet, Take 1 tablet (5 mg total) by mouth 3 (three) times daily as needed for muscle spasms. (Patient not taking: Reported on 02/10/2019), Disp: 30 tablet, Rfl: 1 .  naproxen (NAPROSYN) 500 MG tablet, Take 1 tablet (500 mg total) by mouth 2 (two) times daily with a meal. (Patient not taking: Reported on 02/10/2019), Disp: 60 tablet, Rfl: 2  Allergies  Allergen Reactions  . Latex Other (See Comments)  . Pineapple     Pt stated, "I get swelling in my mouth"    Objective:   There were no vitals taken for this visit. AAOx3, NAD NCAT, EOMI No obvious CN deficits Coloring WNL Pt is able to speak clearly, coherently without shortness of breath or increased work of breathing.  Thought process is linear.  Mood is appropriate.   Assessment and Plan:   Viral URI- new.  Pt reports sxs started suddenly yesterday w/ low grade fever, body aches, sore throat.  Suspect viral illness but given that her aunt is undergoing chemo encouraged her to go for COVID testing today at [facility]).  Tylenol/ibuprofen for pain/fever, OTC antihistamine for nasal congestion.  Fluids, rest.  Attempted to sign pt up for MyChart during visit but was having difficulty w/ activation codes.  Unable to enroll her in COVID monitoring at this time.  Encouraged her to reach out if sxs change or worsen.  Pt expressed understanding and is in agreement w/ plan.    Neena Rhymes, MD 02/10/2019

## 2019-02-12 LAB — NOVEL CORONAVIRUS, NAA: SARS-CoV-2, NAA: NOT DETECTED

## 2019-02-13 ENCOUNTER — Encounter: Payer: Self-pay | Admitting: Internal Medicine

## 2019-02-14 ENCOUNTER — Other Ambulatory Visit (INDEPENDENT_AMBULATORY_CARE_PROVIDER_SITE_OTHER): Payer: BC Managed Care – PPO

## 2019-02-14 DIAGNOSIS — R739 Hyperglycemia, unspecified: Secondary | ICD-10-CM

## 2019-02-14 LAB — HEMOGLOBIN A1C: Hgb A1c MFr Bld: 5.7 % (ref 4.6–6.5)

## 2019-02-27 DIAGNOSIS — Z6829 Body mass index (BMI) 29.0-29.9, adult: Secondary | ICD-10-CM | POA: Diagnosis not present

## 2019-02-27 DIAGNOSIS — E663 Overweight: Secondary | ICD-10-CM | POA: Diagnosis not present

## 2019-04-10 ENCOUNTER — Ambulatory Visit (INDEPENDENT_AMBULATORY_CARE_PROVIDER_SITE_OTHER): Payer: BC Managed Care – PPO | Admitting: Family

## 2019-04-10 ENCOUNTER — Other Ambulatory Visit: Payer: Self-pay

## 2019-04-10 ENCOUNTER — Encounter: Payer: Self-pay | Admitting: Family

## 2019-04-10 ENCOUNTER — Other Ambulatory Visit: Payer: BC Managed Care – PPO

## 2019-04-10 VITALS — BP 110/72 | HR 110 | Temp 97.9°F | Ht 65.0 in | Wt 182.2 lb

## 2019-04-10 DIAGNOSIS — R300 Dysuria: Secondary | ICD-10-CM | POA: Diagnosis not present

## 2019-04-10 LAB — POC URINALSYSI DIPSTICK (AUTOMATED)
Bilirubin, UA: NEGATIVE
Blood, UA: NEGATIVE
Glucose, UA: NEGATIVE
Ketones, UA: NEGATIVE
Leukocytes, UA: NEGATIVE
Nitrite, UA: NEGATIVE
Protein, UA: NEGATIVE
Spec Grav, UA: 1.02 (ref 1.010–1.025)
Urobilinogen, UA: 0.2 E.U./dL
pH, UA: 6.5 (ref 5.0–8.0)

## 2019-04-10 MED ORDER — CIPROFLOXACIN HCL 250 MG PO TABS: 250.0000 mg | ORAL_TABLET | Freq: Two times a day (BID) | ORAL | 0 refills | Status: DC

## 2019-04-10 NOTE — Progress Notes (Signed)
Victoria Wade is a 29 y.o. female with the following history as recorded in Epic[employer]:  Patient Active Problem List   Diagnosis Date Noted  . Dysuria 01/25/2019  . Low back pain 01/25/2019  . Allergic rhinitis 12/07/2016  . Cough 12/07/2016  . Wheezing 12/07/2016  . Obesity (BMI 30-39.9) 06/03/2016  . Headache 08/23/2015  . Head trauma 08/22/2015  . Syncope 08/22/2015  . Large breasts 03/15/2014  . Encounter for well adult exam with abnormal findings 02/11/2011  . Impaired glucose tolerance 02/11/2011  . Migraine 02/11/2011  . TONSILLITIS, CHRONIC 06/12/2009  . BACK PAIN 06/12/2009  . ANEMIA-IRON DEFICIENCY 10/30/2008  . ADHD 10/30/2008  . Mild persistent asthma 10/30/2008  . MENORRHAGIA 10/30/2008    Current Outpatient Medications  Medication Sig Dispense Refill  . ciprofloxacin (CIPRO) 250 MG tablet Take 1 tablet (250 mg total) by mouth 2 (two) times daily. 6 tablet 0  . JUNEL FE 1/20 1-20 MG-MCG tablet Take 1 tablet by mouth daily.    . phentermine 37.5 MG capsule Take 1 capsule (37.5 mg total) by mouth every morning. 30 capsule 2   No current facility-administered medications for this visit.    Allergies: Latex and Pineapple  Past Medical History:  Diagnosis Date  . ADHD 10/30/2008  . ANEMIA-IRON DEFICIENCY 10/30/2008  . ASTHMA 10/30/2008  . BACK PAIN 06/12/2009  . TONSILLITIS, CHRONIC 06/12/2009    Past Surgical History:  Procedure Laterality Date  . none      Family History  Problem Relation Age of Onset  . Cancer Maternal Aunt   . Prostate cancer Maternal Uncle   . Diabetes Other        both side of family  . Hypertension Other        both side of family  . Stroke Other        Aunt  . Cancer Other        Uncle - Prostate    Social History   Tobacco Use  . Smoking status: Never Smoker  . Smokeless tobacco: Never Used  . Tobacco comment: No Children  Substance Use Topics  . Alcohol use: No    Subjective:  Presents with concerns for UTI; notes  that she had a UTI at the end of October; had never had UTI prior to October; symptoms re-flared at the end of last week- having increased back pain, urinary frequency, hesitancy; denies any burning on urination; no prior history of kidney stones, ovarian cysts; denies any vaginal discharge;  Works with chiropractor for chronic low back pain- notes this back pain is "different";   LMP- 3 weeks ago/ on OCPs  Objective:  Vitals:   04/10/19 1138  BP: 110/72  Pulse: (!) 110  Temp: 97.9 F (36.6 C)  TempSrc: Oral  SpO2: 99%  Weight: 182 lb 3.2 oz (82.6 kg)  Height: 5\' 5"  (1.651 m)    General: Well developed, well nourished, in no acute distress  Skin : Warm and dry.  Head: Normocephalic and atraumatic  Lungs: Respirations unlabored; clear to auscultation bilaterally without wheeze, rales, rhonchi  Musculoskeletal: No deformities; no active joint inflammation  Extremities: No edema, cyanosis, clubbing  Vessels: Symmetric bilaterally  Neurologic: Alert and oriented; speech intact; face symmetrical; moves all extremities well; CNII-XII intact without focal deficit   Assessment:  1. Dysuria     Plan:  Check U/A and urine culture; Rx for Cipro 250 mg bid x 3 days- am questioning if infection from early November did not clear  completely; if urine culture is normal, will need to consider pelvic ultrasound and lumbar X-ray.   No follow-ups on file.  Orders Placed This Encounter  Procedures  . Urine Culture    Requested Prescriptions   Signed Prescriptions Disp Refills  . ciprofloxacin (CIPRO) 250 MG tablet 6 tablet 0    Sig: Take 1 tablet (250 mg total) by mouth 2 (two) times daily.

## 2019-04-10 NOTE — Addendum Note (Signed)
Addended by: Karma Ganja on: 04/10/2019 02:57 PM   Modules accepted: Orders

## 2019-04-11 LAB — URINE CULTURE
MICRO NUMBER:: 10008375
Result:: NO GROWTH
SPECIMEN QUALITY:: ADEQUATE

## 2019-04-13 ENCOUNTER — Other Ambulatory Visit: Payer: Self-pay | Admitting: Family

## 2019-04-13 DIAGNOSIS — R102 Pelvic and perineal pain: Secondary | ICD-10-CM

## 2019-04-16 ENCOUNTER — Ambulatory Visit
Admission: RE | Admit: 2019-04-16 | Discharge: 2019-04-16 | Disposition: A | Discharge status: Home / Self Care | Payer: BC Managed Care – PPO | Source: Ambulatory Visit | Attending: Family | Admitting: Family

## 2019-04-16 DIAGNOSIS — R102 Pelvic and perineal pain: Secondary | ICD-10-CM

## 2019-04-16 DIAGNOSIS — D259 Leiomyoma of uterus, unspecified: Secondary | ICD-10-CM | POA: Diagnosis not present

## 2019-04-17 ENCOUNTER — Encounter: Payer: Self-pay | Admitting: Family

## 2019-06-12 ENCOUNTER — Encounter: Payer: Self-pay | Admitting: Internal Medicine

## 2019-06-13 ENCOUNTER — Ambulatory Visit (INDEPENDENT_AMBULATORY_CARE_PROVIDER_SITE_OTHER): Payer: BC Managed Care – PPO | Admitting: Internal Medicine

## 2019-06-13 ENCOUNTER — Encounter: Payer: Self-pay | Admitting: Internal Medicine

## 2019-06-13 ENCOUNTER — Other Ambulatory Visit: Payer: Self-pay

## 2019-06-13 DIAGNOSIS — R7302 Impaired glucose tolerance (oral): Secondary | ICD-10-CM

## 2019-06-13 DIAGNOSIS — F909 Attention-deficit hyperactivity disorder, unspecified type: Secondary | ICD-10-CM

## 2019-06-13 DIAGNOSIS — F329 Major depressive disorder, single episode, unspecified: Secondary | ICD-10-CM | POA: Diagnosis not present

## 2019-06-13 DIAGNOSIS — F32A Depression, unspecified: Secondary | ICD-10-CM | POA: Insufficient documentation

## 2019-06-13 DIAGNOSIS — F419 Anxiety disorder, unspecified: Secondary | ICD-10-CM | POA: Diagnosis not present

## 2019-06-13 MED ORDER — CITALOPRAM HYDROBROMIDE 20 MG PO TABS: 20.0000 mg | ORAL_TABLET | Freq: Every day | ORAL | 3 refills | Status: DC

## 2019-06-13 MED ORDER — PHENTERMINE HCL 37.5 MG PO CAPS: 37.5000 mg | ORAL_CAPSULE | ORAL | 2 refills | Status: DC

## 2019-06-13 MED ORDER — AMPHETAMINE-DEXTROAMPHETAMINE 20 MG PO TABS: 20.0000 mg | ORAL_TABLET | Freq: Two times a day (BID) | ORAL | 0 refills | Status: DC

## 2019-06-13 NOTE — Patient Instructions (Signed)
The office fax number is [phone], or you can drop off the form if needed  Please take all new medication as prescribed - the celexa for stress and depression, the phentermine for wt loss, and the adderall for ADD  Please continue your counseling  You will be contacted regarding the referral for: Psychiatry  You should be ok to start with off work for 4 weeks  Please continue all other medications as before, and refills have been done if requested.  Please have the pharmacy call with any other refills you may need.  Please keep your appointments with your specialists as you may have planned

## 2019-06-13 NOTE — Progress Notes (Signed)
Subjective:    Patient ID: Victoria Wade, female    DOB: 07-14-1990, 29 y.o.   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  Signed up for work supported mental health therapy for stress, anxiety and depression.  Working 12 hr days. No SI or HI.  Just cant do it anymore. Finds herself stress eating with wt gain.  Also asks for ADD tx as cannot seem to complete tasks nearly as well now.  Has been an issue for years.  Willing to consider psychiatry referral as well. Pt denies chest pain, increased sob or doe, wheezing, orthopnea, PND, increased LE swelling, palpitations, dizziness or syncope.   Pt denies polydipsia, polyuria Past Medical History:  Diagnosis Date  . ADHD 10/30/2008  . ANEMIA-IRON DEFICIENCY 10/30/2008  . ASTHMA 10/30/2008  . BACK PAIN 06/12/2009  . TONSILLITIS, CHRONIC 06/12/2009   Past Surgical History:  Procedure Laterality Date  . none      reports that she has never smoked. She has never used smokeless tobacco. She reports that she does not drink alcohol or use drugs. family history includes Cancer in her maternal aunt and another family member; Diabetes in an other family member; Hypertension in an other family member; Prostate cancer in her maternal uncle; Stroke in an other family member. Allergies  Allergen Reactions  . Latex Other (See Comments)  . Pineapple     Pt stated, "I get swelling in my mouth"   Current Outpatient Medications on File Prior to Visit  Medication Sig Dispense Refill  . JUNEL FE 1/20 1-20 MG-MCG tablet Take 1 tablet by mouth daily.     No current facility-administered medications on file prior to visit.   Review of Systems All otherwise neg per pt     Objective:   Physical Exam BP 122/78   Pulse 84   Temp 97.8 F (36.6 C)   Ht 5\' 5"  (1.651 m)   Wt 191 lb (86.6 kg)   SpO2 100%    BMI 31.78 kg/m  VS noted,  Constitutional: Pt appears in NAD HENT: Head: NCAT.  Right Ear: External ear normal.  Left Ear: External ear normal.  Eyes: . Pupils are equal, round, and reactive to light. Conjunctivae and EOM are normal Nose: without d/c or deformity Neck: Neck supple. Gross normal ROM Cardiovascular: Normal rate and regular rhythm.   Pulmonary/Chest: Effort normal and breath sounds without rales or wheezing.  Abd:  Soft, NT, ND, + BS, no organomegaly Neurological: Pt is alert. At baseline orientation, motor grossly intact Skin: Skin is warm. No rashes, other new lesions, no LE edema Psychiatric: Pt behavior is normal without agitation  All otherwise neg per pt Lab Results  Component Value Date   WBC 6.2 01/25/2019   HGB 12.8 01/25/2019   HCT 38.6 01/25/2019   PLT 248.0 01/25/2019   GLUCOSE 87 01/25/2019   CHOL 165 01/25/2019   TRIG 67.0 01/25/2019   HDL 43.40 01/25/2019   LDLCALC 109 (H) 01/25/2019   ALT 18 01/25/2019   AST 20 01/25/2019   NA 136 01/25/2019   K 3.8 01/25/2019   CL 104 01/25/2019   CREATININE 0.82 01/25/2019   BUN 12 01/25/2019   CO2 26 01/25/2019   TSH 2.21 01/25/2019   HGBA1C 5.7 02/14/2019      Assessment & Plan:

## 2019-06-14 ENCOUNTER — Telehealth: Payer: Self-pay

## 2019-06-14 MED ORDER — PHENTERMINE HCL 37.5 MG PO TABS: 37.5000 mg | ORAL_TABLET | Freq: Every day | ORAL | 2 refills | Status: DC

## 2019-06-14 NOTE — Telephone Encounter (Signed)
Done erx 

## 2019-06-14 NOTE — Telephone Encounter (Signed)
Key: [id number]

## 2019-06-14 NOTE — Addendum Note (Signed)
Addended by: Corwin Levins on: 06/14/2019 12:59 PM   Modules accepted: Orders

## 2019-06-17 ENCOUNTER — Encounter: Payer: Self-pay | Admitting: Internal Medicine

## 2019-06-17 NOTE — Assessment & Plan Note (Signed)
Also for adderall 20 bid,  to f/u any worsening symptoms or concerns

## 2019-06-17 NOTE — Assessment & Plan Note (Signed)
stable overall by history and exam, recent data reviewed with pt, and pt to continue medical treatment as before,  to f/u any worsening symptoms or concerns  

## 2019-06-17 NOTE — Assessment & Plan Note (Addendum)
Mod to severe, for celexa 20 qd, cont counseling, refer psychiatry, for note for out of work x 4 wks  I spent 40 minutes in preparing to see the patient by review of recent labs, imaging and procedures, obtaining and reviewing separately obtained history, communicating with the patient and family or caregiver, ordering medications, tests or procedures, and documenting clinical information in the EHR including the differential Dx, treatment, and any further evaluation and other management of anxiety, depression, ADD, hyperglycemia

## 2019-06-18 NOTE — Telephone Encounter (Signed)
Forms have been completed and placed in providers box to review and sign.  °

## 2019-06-19 ENCOUNTER — Encounter: Payer: Self-pay | Admitting: Internal Medicine

## 2019-06-19 DIAGNOSIS — Z0279 Encounter for issue of other medical certificate: Secondary | ICD-10-CM

## 2019-06-19 MED ORDER — PHENTERMINE HCL 37.5 MG PO TABS: 37.5000 mg | ORAL_TABLET | Freq: Every day | ORAL | 2 refills | Status: DC

## 2019-06-19 MED FILL — PHENTERMINE 37.5 MG TABLET: 37.5 | 30 days supply | Qty: 30 | Fill #0

## 2019-06-19 NOTE — Telephone Encounter (Signed)
Forms have been signed, Faxed, Copy sent to scan, &Charged for.   Original mailed to patient, &Patient informed.

## 2019-06-22 DIAGNOSIS — L818 Other specified disorders of pigmentation: Secondary | ICD-10-CM | POA: Diagnosis not present

## 2019-06-23 NOTE — Telephone Encounter (Signed)
Late entry: Paperwork completed and has been faxed.

## 2019-06-29 ENCOUNTER — Encounter: Payer: Self-pay | Admitting: Internal Medicine

## 2019-07-03 ENCOUNTER — Encounter: Payer: BC Managed Care – PPO | Admitting: Internal Medicine

## 2019-07-12 ENCOUNTER — Encounter: Payer: Self-pay | Admitting: Internal Medicine

## 2019-07-12 ENCOUNTER — Ambulatory Visit (INDEPENDENT_AMBULATORY_CARE_PROVIDER_SITE_OTHER): Payer: BC Managed Care – PPO | Admitting: Internal Medicine

## 2019-07-12 ENCOUNTER — Other Ambulatory Visit: Payer: Self-pay

## 2019-07-12 VITALS — BP 118/80 | HR 94 | Temp 98.3°F | Ht 65.0 in | Wt 183.4 lb

## 2019-07-12 DIAGNOSIS — M549 Dorsalgia, unspecified: Secondary | ICD-10-CM | POA: Insufficient documentation

## 2019-07-12 DIAGNOSIS — R739 Hyperglycemia, unspecified: Secondary | ICD-10-CM

## 2019-07-12 DIAGNOSIS — Z0001 Encounter for general adult medical examination with abnormal findings: Secondary | ICD-10-CM

## 2019-07-12 DIAGNOSIS — E559 Vitamin D deficiency, unspecified: Secondary | ICD-10-CM | POA: Diagnosis not present

## 2019-07-12 DIAGNOSIS — N62 Hypertrophy of breast: Secondary | ICD-10-CM | POA: Diagnosis not present

## 2019-07-12 DIAGNOSIS — F32A Depression, unspecified: Secondary | ICD-10-CM

## 2019-07-12 DIAGNOSIS — E538 Deficiency of other specified B group vitamins: Secondary | ICD-10-CM

## 2019-07-12 DIAGNOSIS — F329 Major depressive disorder, single episode, unspecified: Secondary | ICD-10-CM

## 2019-07-12 DIAGNOSIS — Z114 Encounter for screening for human immunodeficiency virus [HIV]: Secondary | ICD-10-CM

## 2019-07-12 DIAGNOSIS — F909 Attention-deficit hyperactivity disorder, unspecified type: Secondary | ICD-10-CM

## 2019-07-12 DIAGNOSIS — F419 Anxiety disorder, unspecified: Secondary | ICD-10-CM

## 2019-07-12 LAB — VITAMIN B12: Vitamin B-12: 303 pg/mL (ref 211–911)

## 2019-07-12 LAB — CBC WITH DIFFERENTIAL/PLATELET
Basophils Absolute: 0 10*3/uL (ref 0.0–0.1)
Basophils Relative: 0.5 % (ref 0.0–3.0)
Eosinophils Absolute: 0.1 10*3/uL (ref 0.0–0.7)
Eosinophils Relative: 0.9 % (ref 0.0–5.0)
HCT: 38.3 % (ref 36.0–46.0)
Hemoglobin: 12.9 g/dL (ref 12.0–15.0)
Lymphocytes Relative: 28.3 % (ref 12.0–46.0)
Lymphs Abs: 2 10*3/uL (ref 0.7–4.0)
MCHC: 33.8 g/dL (ref 30.0–36.0)
MCV: 89.2 fl (ref 78.0–100.0)
Monocytes Absolute: 0.6 10*3/uL (ref 0.1–1.0)
Monocytes Relative: 7.7 % (ref 3.0–12.0)
Neutro Abs: 4.5 10*3/uL (ref 1.4–7.7)
Neutrophils Relative %: 62.6 % (ref 43.0–77.0)
Platelets: 282 10*3/uL (ref 150.0–400.0)
RBC: 4.29 Mil/uL (ref 3.87–5.11)
RDW: 12.9 % (ref 11.5–15.5)
WBC: 7.1 10*3/uL (ref 4.0–10.5)

## 2019-07-12 LAB — BASIC METABOLIC PANEL
BUN: 9 mg/dL (ref 6–23)
CO2: 27 mEq/L (ref 19–32)
Calcium: 9.3 mg/dL (ref 8.4–10.5)
Chloride: 104 mEq/L (ref 96–112)
Creatinine, Ser: 0.82 mg/dL (ref 0.40–1.20)
GFR: 99.41 mL/min (ref >60.00)
Glucose, Bld: 111 mg/dL — ABNORMAL HIGH (ref 70–99)
Potassium: 3.5 mEq/L (ref 3.5–5.1)
Sodium: 138 mEq/L (ref 135–145)

## 2019-07-12 LAB — URINALYSIS, ROUTINE W REFLEX MICROSCOPIC
Bilirubin Urine: NEGATIVE
Hgb urine dipstick: NEGATIVE
Ketones, ur: NEGATIVE
Leukocytes,Ua: NEGATIVE
Nitrite: NEGATIVE
Specific Gravity, Urine: 1.025 (ref 1.000–1.030)
Total Protein, Urine: NEGATIVE
Urine Glucose: NEGATIVE
Urobilinogen, UA: 0.2 (ref 0.0–1.0)
pH: 6 (ref 5.0–8.0)

## 2019-07-12 LAB — HEMOGLOBIN A1C: Hgb A1c MFr Bld: 5.4 % (ref 4.6–6.5)

## 2019-07-12 LAB — LIPID PANEL
Cholesterol: 168 mg/dL (ref 0–200)
HDL: 40.9 mg/dL (ref >39.00)
LDL Cholesterol: 114 mg/dL — ABNORMAL HIGH (ref 0–99)
NonHDL: 126.86
Total CHOL/HDL Ratio: 4
Triglycerides: 66 mg/dL (ref 0.0–149.0)
VLDL: 13.2 mg/dL (ref 0.0–40.0)

## 2019-07-12 LAB — VITAMIN D 25 HYDROXY (VIT D DEFICIENCY, FRACTURES): VITD: 33.42 ng/mL (ref 30.00–100.00)

## 2019-07-12 LAB — HEPATIC FUNCTION PANEL
ALT: 18 U/L (ref 0–35)
AST: 22 U/L (ref 0–37)
Albumin: 4.4 g/dL (ref 3.5–5.2)
Alkaline Phosphatase: 58 U/L (ref 39–117)
Bilirubin, Direct: 0.1 mg/dL (ref 0.0–0.3)
Total Bilirubin: 0.4 mg/dL (ref 0.2–1.2)
Total Protein: 7.6 g/dL (ref 6.0–8.3)

## 2019-07-12 LAB — TSH: TSH: 3.36 u[IU]/mL (ref 0.35–4.50)

## 2019-07-12 NOTE — Assessment & Plan Note (Addendum)
Also for plastic surgury referral  I spent 31 minutes in addition to time for CPX wellness examination in preparing to see the patient by review of recent labs, imaging and procedures, obtaining and reviewing separately obtained history, communicating with the patient and family or caregiver, ordering medications, tests or procedures, and documenting clinical information in the EHR including the differential Dx, treatment, and any further evaluation and other management of large breasts with upper back pain, ADD and depression anxiety

## 2019-07-12 NOTE — Progress Notes (Signed)
Subjective:    Patient ID: Victoria Wade, female    DOB: 1991/03/18, 29 y.o.   MRN: [id number]  HPI  Here for wellness and f/u;  Overall doing ok;  Pt denies Chest pain, worsening SOB, DOE, wheezing, orthopnea, PND, worsening LE edema, palpitations, dizziness or syncope.  Pt denies neurological change such as new headache, facial or extremity weakness.  Pt denies polydipsia, polyuria, or low sugar symptoms. Pt states overall good compliance with treatment and medications, good tolerability, and has been trying to follow appropriate diet.  Pt denies worsening depressive symptoms, suicidal ideation or panic. No fever, night sweats, wt loss, loss of appetite, or other constitutional symptoms.  Pt states good ability with ADL's, has low fall risk, home safety reviewed and adequate, no other significant changes in hearing or vision, and only occasionally active with exercise.  Anxiety/depression improved with celexa and counseling, plans to return to work as planned apr 22.  Has not yet started the adderall, plans to do so when returns to work.  Also c/o .> 6 mo bilateral upper back pain due to large breasts, and cannot wear typical bras, only sports attire b/c dig in too much.   Past Medical History:  Diagnosis Date  . ADHD 10/30/2008  . ANEMIA-IRON DEFICIENCY 10/30/2008  . ASTHMA 10/30/2008  . BACK PAIN 06/12/2009  . TONSILLITIS, CHRONIC 06/12/2009   Past Surgical History:  Procedure Laterality Date  . none      reports that she has never smoked. She has never used smokeless tobacco. She reports that she does not drink alcohol or use drugs. family history includes Cancer in her maternal aunt and another family member; Diabetes in an other family member; Hypertension in an other family member; Prostate cancer in her maternal uncle; Stroke in an other family member. Allergies  Allergen Reactions  . Latex Other (See Comments)  . Pineapple     Pt stated, "I get swelling in my mouth"   Current  Outpatient Medications on File Prior to Visit  Medication Sig Dispense Refill  . amphetamine-dextroamphetamine (ADDERALL) 20 MG tablet Take 1 tablet (20 mg total) by mouth 2 (two) times daily. 60 tablet 0  . citalopram (CELEXA) 20 MG tablet Take 1 tablet (20 mg total) by mouth daily. 90 tablet 3  . JUNEL FE 1/20 1-20 MG-MCG tablet Take 1 tablet by mouth daily.    . phentermine (ADIPEX-P) 37.5 MG tablet Take 1 tablet (37.5 mg total) by mouth daily before breakfast. 30 tablet 2   No current facility-administered medications on file prior to visit.   Review of Systems All otherwise neg per pt     Objective:   Physical Exam BP 118/80   Pulse 94   Temp 98.3 F (36.8 C)   Ht 5\' 5"  (1.651 m)   Wt 183 lb 6.4 oz (83.2 kg)   SpO2 99%   BMI 30.52 kg/m  VS noted,  Constitutional: Pt appears in NAD HENT: Head: NCAT.  Right Ear: External ear normal.  Left Ear: External ear normal.  Eyes: . Pupils are equal, round, and reactive to light. Conjunctivae and EOM are normal Nose: without d/c or deformity Neck: Neck supple. Gross normal ROM Breasts ; large biateral Cardiovascular: Normal rate and regular rhythm.   Pulmonary/Chest: Effort normal and breath sounds without rales or wheezing.  Abd:  Soft, NT, ND, + BS, no organomegaly Neurological: Pt is alert. At baseline orientation, motor grossly intact Skin: Skin is warm. No rashes, other new lesions,  no LE edema Psychiatric: Pt behavior is normal without agitation  All otherwise neg per pt Lab Results  Component Value Date   WBC 7.1 07/12/2019   HGB 12.9 07/12/2019   HCT 38.3 07/12/2019   PLT 282.0 07/12/2019   GLUCOSE 111 (H) 07/12/2019   CHOL 168 07/12/2019   TRIG 66.0 07/12/2019   HDL 40.90 07/12/2019   LDLCALC 114 (H) 07/12/2019   ALT 18 07/12/2019   AST 22 07/12/2019   NA 138 07/12/2019   K 3.5 07/12/2019   CL 104 07/12/2019   CREATININE 0.82 07/12/2019   BUN 9 07/12/2019   CO2 27 07/12/2019   TSH 3.36 07/12/2019   HGBA1C  5.4 07/12/2019      Assessment & Plan:

## 2019-07-12 NOTE — Patient Instructions (Signed)
Please continue all other medications as before, and refills have been done if requested.  Please have the pharmacy call with any other refills you may need.  Please continue your efforts at being more active, low cholesterol diet, and weight control.  You are otherwise up to date with prevention measures today.  Please keep your appointments with your specialists as you may have planned  You will be contacted regarding the referral for: plastic surgury  Please go to the LAB at the blood drawing area for the tests to be done  You will be contacted by phone if any changes need to be made immediately.  Otherwise, you will receive a letter about your results with an explanation, but please check with MyChart first.  Please remember to sign up for MyChart if you have not done so, as this will be important to you in the future with finding out test results, communicating by private email, and scheduling acute appointments online when needed.  Please make an Appointment to return for your 1 year visit, or sooner if needed

## 2019-07-12 NOTE — Assessment & Plan Note (Signed)

## 2019-07-12 NOTE — Assessment & Plan Note (Signed)
For tylenol prn, but symptomatic of large breasts

## 2019-07-12 NOTE — Assessment & Plan Note (Signed)
Improved, continue counseling and ssRI

## 2019-07-12 NOTE — Assessment & Plan Note (Signed)
To start adderall soon

## 2019-07-13 ENCOUNTER — Telehealth: Payer: Self-pay

## 2019-07-13 LAB — HIV ANTIBODY (ROUTINE TESTING W REFLEX): HIV 1&2 Ab, 4th Generation: NONREACTIVE

## 2019-07-13 NOTE — Telephone Encounter (Signed)
Faxed and confirmed  07/12/2019 office note to [facility] as requested

## 2019-07-18 ENCOUNTER — Encounter: Payer: Self-pay | Admitting: Internal Medicine

## 2019-07-24 ENCOUNTER — Encounter: Payer: Self-pay | Admitting: Internal Medicine

## 2019-07-24 NOTE — Telephone Encounter (Signed)
Ok to fax last visit notes; I dont have other documentation than this

## 2019-08-01 ENCOUNTER — Encounter: Payer: Self-pay | Admitting: Internal Medicine

## 2019-08-02 MED ORDER — ALBUTEROL SULFATE HFA 108 (90 BASE) MCG/ACT IN AERS: 2.0000 | INHALATION_SPRAY | Freq: Four times a day (QID) | RESPIRATORY_TRACT | 2 refills | Status: DC | PRN

## 2019-08-03 MED FILL — PHENTERMINE 37.5 MG TABLET: 37.5 | 30 days supply | Qty: 30 | Fill #1

## 2019-08-22 DIAGNOSIS — N62 Hypertrophy of breast: Secondary | ICD-10-CM | POA: Diagnosis not present

## 2019-08-22 DIAGNOSIS — M546 Pain in thoracic spine: Secondary | ICD-10-CM | POA: Diagnosis not present

## 2019-08-22 DIAGNOSIS — M542 Cervicalgia: Secondary | ICD-10-CM | POA: Diagnosis not present

## 2019-08-23 ENCOUNTER — Institutional Professional Consult (permissible substitution): Payer: BC Managed Care – PPO | Admitting: Plastic Surgery

## 2019-09-04 DIAGNOSIS — S0501XA Injury of conjunctiva and corneal abrasion without foreign body, right eye, initial encounter: Secondary | ICD-10-CM | POA: Diagnosis not present

## 2019-09-08 MED FILL — PHENTERMINE 37.5 MG TABLET: 37.5 | 30 days supply | Qty: 30 | Fill #2

## 2019-09-17 DIAGNOSIS — Z01419 Encounter for gynecological examination (general) (routine) without abnormal findings: Secondary | ICD-10-CM | POA: Diagnosis not present

## 2019-09-17 DIAGNOSIS — Z6828 Body mass index (BMI) 28.0-28.9, adult: Secondary | ICD-10-CM | POA: Diagnosis not present

## 2019-10-04 DIAGNOSIS — M545 Low back pain: Secondary | ICD-10-CM | POA: Diagnosis not present

## 2019-10-04 DIAGNOSIS — M6281 Muscle weakness (generalized): Secondary | ICD-10-CM | POA: Diagnosis not present

## 2019-10-04 DIAGNOSIS — M62830 Muscle spasm of back: Secondary | ICD-10-CM | POA: Diagnosis not present

## 2019-10-18 ENCOUNTER — Encounter: Payer: Self-pay | Admitting: Internal Medicine

## 2019-10-18 DIAGNOSIS — M62830 Muscle spasm of back: Secondary | ICD-10-CM | POA: Diagnosis not present

## 2019-10-18 DIAGNOSIS — M545 Low back pain: Secondary | ICD-10-CM | POA: Diagnosis not present

## 2019-10-18 DIAGNOSIS — M6281 Muscle weakness (generalized): Secondary | ICD-10-CM | POA: Diagnosis not present

## 2019-11-12 ENCOUNTER — Encounter: Payer: Self-pay | Admitting: Internal Medicine

## 2019-11-28 ENCOUNTER — Other Ambulatory Visit: Payer: Self-pay

## 2019-11-28 ENCOUNTER — Ambulatory Visit (INDEPENDENT_AMBULATORY_CARE_PROVIDER_SITE_OTHER): Payer: BC Managed Care – PPO | Admitting: Family Medicine

## 2019-11-28 ENCOUNTER — Encounter: Payer: Self-pay | Admitting: Family Medicine

## 2019-11-28 VITALS — BP 102/78 | HR 96 | Ht 65.0 in | Wt 179.6 lb

## 2019-11-28 DIAGNOSIS — M545 Low back pain, unspecified: Secondary | ICD-10-CM

## 2019-11-28 DIAGNOSIS — M7061 Trochanteric bursitis, right hip: Secondary | ICD-10-CM | POA: Diagnosis not present

## 2019-11-28 NOTE — Patient Instructions (Signed)
Thank you for coming in today.  Plan for PT.  Continue heat and TENS unit.  If not improving in 4-6 weeks let me know and I will arrange for xray/MRI.   Call or go to the ER if you develop a large red swollen joint with extreme pain or oozing puss.   OK to advance activity as tolerated.

## 2019-11-28 NOTE — Progress Notes (Signed)
   Subjective:    I'm seeing this patient as a consultation for:  Dr. Jonny Ruiz. Note will be routed back to referring provider/PCP.  CC: Low back pain  I, Molly Weber, LAT, ATC, am serving as scribe for Dr. Clementeen Graham.  HPI: Pt is a 29 y/o female presenting w/ c/o low back pain x approximately 2 months after doing a dead lift at the gym.  She locates her back pain to her B low back.  Radiating pain: yes into her B glutes LE numbness/tingling: No LE weakness: No Aggravating factors: lumbar flexion; coughing/sneezing; transitioning from supine to sit Treatments tried: Tylenol; IBU; massage  Past medical history, Surgical history, Family history, Social history, Allergies, and medications have been entered into the medical record, reviewed.   Review of Systems: No new headache, visual changes, nausea, vomiting, diarrhea, constipation, dizziness, abdominal pain, skin rash, fevers, chills, night sweats, weight loss, swollen lymph nodes, body aches, joint swelling, muscle aches, chest pain, shortness of breath, mood changes, visual or auditory hallucinations.   Objective:    Vitals:   11/28/19 1250  BP: 102/78  Pulse: 96  SpO2: 98%   General: Well Developed, well nourished, and in no acute distress.  Neuro/Psych: Alert and oriented x3, extra-ocular muscles intact, able to move all 4 extremities, sensation grossly intact. Skin: Warm and dry, no rashes noted.  Respiratory: Not using accessory muscles, speaking in full sentences, trachea midline.  Cardiovascular: Pulses palpable, no extremity edema. Abdomen: Does not appear distended. MSK: L-spine normal-appearing Nontender midline. Tender palpation right lumbar paraspinal musculature. Normal lumbar motion. Lower extremity strength reflexes and sensation are intact distally except noted below. Negative slump test and straight leg raise test. Right hip normal-appearing Normal motion. Tender palpation greater trochanter. Reduced  strength hip abduction and external rotation 4/5. Left hip normal-appearing Normal motion. Nontender. Normal strength.    Impression and Recommendations:    Assessment and Plan: 29 y.o. female with low back pain and right lateral hip pain ongoing for about 2 months.  Pain consistent with lumbosacral strain muscle dysfunction and hip abductor tendinopathy/trochanteric bursitis.  Patient has not improved with conservative management for 2 months.  Plan for trial of physical therapy.  If not improving in about 4 to 6 weeks she will let me know and we will proceed likely to MRI at that point..   Orders Placed This Encounter  Procedures  . Ambulatory referral to Physical Therapy    Referral Priority:   Routine    Referral Type:   Physical Medicine    Referral Reason:   Specialty Services Required    Requested Specialty:   Physical Therapy   No orders of the defined types were placed in this encounter.   Discussed warning signs or symptoms. Please see discharge instructions. Patient expresses understanding.   The above documentation has been reviewed and is accurate and complete Clementeen Graham, M.D.

## 2019-12-14 ENCOUNTER — Encounter: Payer: Self-pay | Admitting: Rehabilitative and Restorative Service Providers"

## 2019-12-14 ENCOUNTER — Ambulatory Visit (INDEPENDENT_AMBULATORY_CARE_PROVIDER_SITE_OTHER): Payer: BC Managed Care – PPO | Admitting: Rehabilitative and Restorative Service Providers"

## 2019-12-14 ENCOUNTER — Telehealth: Payer: Self-pay | Admitting: Internal Medicine

## 2019-12-14 ENCOUNTER — Other Ambulatory Visit: Payer: Self-pay

## 2019-12-14 DIAGNOSIS — M545 Low back pain, unspecified: Secondary | ICD-10-CM

## 2019-12-14 DIAGNOSIS — M6281 Muscle weakness (generalized): Secondary | ICD-10-CM | POA: Diagnosis not present

## 2019-12-14 DIAGNOSIS — T783XXA Angioneurotic edema, initial encounter: Secondary | ICD-10-CM

## 2019-12-14 DIAGNOSIS — M25551 Pain in right hip: Secondary | ICD-10-CM

## 2019-12-14 NOTE — Addendum Note (Signed)
Addended by: Chyrel Masson B on: 12/14/2019 10:19 AM   Modules accepted: Orders

## 2019-12-14 NOTE — Telephone Encounter (Signed)
Ok this is done 

## 2019-12-14 NOTE — Telephone Encounter (Signed)
Sent to Dr. John. 

## 2019-12-14 NOTE — Telephone Encounter (Signed)
    Patient states after eating a slice of pizza on 9/9 she began having facial swelling and hives. She took Benadryl which provided some relief. However today her throat feels "tight" and symptoms continue. Patient requesting referral for allergy specialist  Call also transferred to [facility] for immediate advice

## 2019-12-14 NOTE — Patient Instructions (Signed)
Access Code: [email or web address] URL: [email or web address] Date: 12/14/2019 Prepared by: Chyrel Masson  Exercises Bird Dog - 1 x daily - 7 x weekly - 2 sets - 10 reps - 3 hold Supine Lower Trunk Rotation - 2 x daily - 7 x weekly - 5 reps - 1 sets - 15 hold Supine Pelvic Tilt with Straight Leg Raise - 1 x daily - 7 x weekly - 3 sets - 10 reps Supine Active Knee Extension with Hand Support and Leg Straight - 1 x daily - 7 x weekly - 3 sets - 10 reps Supine Bridge - 1 x daily - 7 x weekly - 3 sets - 10 reps - 2 hold

## 2019-12-14 NOTE — Therapy (Addendum)
[facility] Physical Therapy [address] [place], [place], [address] Phone: [phone]   Fax:  [phone]  Physical Therapy Evaluation  Patient Details  Name: Victoria Wade MRN: [id number] Date of Birth: [birth year] Referring Provider (PT): Dr. Clementeen Graham   Encounter Date: 12/14/2019   PT End of Session - 12/14/19 0932    Visit Number 1    Number of Visits 12    Date for PT Re-Evaluation 02/08/20    PT Start Time 0933    PT Stop Time 1010    PT Time Calculation (min) 37 min    Activity Tolerance Patient tolerated treatment well    Behavior During Therapy [facility] for tasks assessed/performed           Past Medical History:  Diagnosis Date  . ADHD 10/30/2008  . ANEMIA-IRON DEFICIENCY 10/30/2008  . ASTHMA 10/30/2008  . BACK PAIN 06/12/2009  . TONSILLITIS, CHRONIC 06/12/2009    Past Surgical History:  Procedure Laterality Date  . none      There were no vitals filed for this visit.    Subjective Assessment - 12/14/19 0935    Subjective Pt. comes to clinic for back and Rt hip pain.  Pt. indicated performing dead lifts c trainer about 2-3 months ago that resulted in back pain starting.  Pt. indicated self management, stretching, massage but continued having complaints.  Pt. stated worsening in last month or so that showed increased difficulty due to symptoms. Pt. has seen Dr. Denyse Amass about complaints who referred for physical therapy.  Pt. indicated intermittent symptoms noted in last few weeks.  Pt. stated taking time off gym until recently and has started some return.    Patient Stated Goals Reduce pain, return to recreational activity/exercise routine    Currently in Pain? Yes    Pain Score 2    pain at worst 7/10.   Pain Location Back   back/hip   Pain Orientation Right;Lower    Pain Type Acute pain    Pain Radiating Towards back to Rt buttock    Pain Onset More than a month ago    Pain Frequency Intermittent    Aggravating Factors  bending,  sneezing, car transfers at times, some workout stuff    Pain Relieving Factors Medicine, icy hot patches    Effect of Pain on Daily Activities Limited in exercise routine              [facility] PT Assessment - 12/14/19 0001      Assessment   Medical Diagnosis Low back pain, Rt hip pain/bursitis    Referring Provider (PT) Dr. Clementeen Graham    Onset Date/Surgical Date 09/04/19    Hand Dominance Right    Prior Therapy No physical therapy but has seen chiro in past, massage      Precautions   Precautions None      Restrictions   Weight Bearing Restrictions No      Balance Screen   Has the patient fallen in the past 6 months No    Is the patient reluctant to leave their home because of a fear of falling?  No      Home Environment   Living Environment Private residence    Additional Comments Flight of stairs to room.       Prior Function   Level of Independence Independent    Vocation Requirements Prolonged sitting    Leisure Exercise/weight lifting      Cognition   Overall Cognitive Status Within Functional  Limits for tasks assessed      Functional Tests   Functional tests Squat;Single leg stance      Squat   Comments Unremarkable symptoms, increased upper lumbar extension noted      Single Leg Stance   Comments Rt and Lt SLS 30 seconds on non compliant floor      Posture/Postural Control   Posture Comments Mild increase in upper to mid lumbar lordosis, anterior pelvic tilt noted      ROM / Strength   AROM / PROM / Strength Strength;PROM;AROM      AROM   AROM Assessment Site Lumbar;Hip    Right/Left Hip Left;Right    Lumbar Flexion movement to floor, sore in back, Rt tenderness in buttock/thigh    Lumbar Extension 75% WFL c "tenderness in lumbar".  REIS x 5 : no change    Lumbar - Right Side Bend moved to head of fibula, no complaint    Lumbar - Left Side Bend Moved to head of fibula, no complaints      PROM   PROM Assessment Site Hip    Right/Left Hip Left;Right       Strength   Overall Strength Comments Fair recruitment activation of TrA/pelvic floor    Strength Assessment Site Hip;Knee;Ankle    Right/Left Hip Left;Right    Right Hip Flexion 5/5    Left Hip Flexion 5/5    Right/Left Knee Right;Left    Right Knee Flexion 5/5    Right Knee Extension 5/5    Left Knee Flexion 5/5    Left Knee Extension 5/5    Right/Left Ankle Left;Right    Right Ankle Dorsiflexion 5/5    Left Ankle Dorsiflexion 5/5      Flexibility   Soft Tissue Assessment /Muscle Length yes    Hamstrings passive SLR bilateral > 90 deg      Palpation   Palpation comment Tenderness and TrP noted in Rt glute max, piriformis      Special Tests   Other special tests (+) slump on Rt for symptoms in posterior thigh at full range movement.  (-) Crossed SLR, passive SLR tension bilateral                      Objective measurements completed on examination: See above findings.       [facility] Adult PT Treatment/Exercise - 12/14/19 0001      Exercises   Exercises Lumbar;Knee/Hip;Other Exercises    Other Exercises  Pt. education and cues for intervention for home use.  Review of pain management adaption strategy for exercise return      Lumbar Exercises: Stretches   Lower Trunk Rotation 5 reps   15 sec bilat     Lumbar Exercises: Supine   Bridge 10 reps    Straight Leg Raise 10 reps   bilateral c posterior pelvic tilt focus   Other Supine Lumbar Exercises supine sciatic nerve flossing Rt x 10 c cues      Lumbar Exercises: Quadruped   Opposite Arm/Leg Raise 10 reps;Left arm/Right leg;Right arm/Left leg      Manual Therapy   Manual therapy comments Percussive device soft tissue mobilization Rt glute max, piriformis region                  PT Education - 12/14/19 0932    Education Details HEP, POC    Person(s) Educated Patient    Methods Explanation;Demonstration;Verbal cues;Handout    Comprehension Returned demonstration;Verbalized understanding  PT Short Term Goals - 12/14/19 1016      PT SHORT TERM GOAL #1   Title Patient will demonstrate independent use of home exercise program to maintain progress from in clinic treatments.    Time 2    Period Weeks    Status New    Target Date 12/28/19             PT Long Term Goals - 12/14/19 1014      PT LONG TERM GOAL #1   Title Patient will demonstrate/report pain at worst less than or equal to 2/10 to facilitate minimal limitation in daily activity secondary to pain symptoms.    Time 8    Period Weeks    Status New    Target Date 02/08/20      PT LONG TERM GOAL #2   Title v    Time 8    Period Weeks    Status New    Target Date 02/08/20      PT LONG TERM GOAL #3   Title Patient will demonstrate return to work/recreational activity at previous level of function without limitations secondary due to condition    Time 8    Period Weeks    Status New    Target Date 02/08/20      PT LONG TERM GOAL #4   Title Pt. will demonstrate sciatic nerve slump testing negative bilateral for return to PLOF in mobility.    Time 8    Period Weeks    Status New    Target Date 02/08/20      PT LONG TERM GOAL #5   Title Pt. will demonstrate lumbar AROM WFL s symptoms to facilitate usual workout mobility at PLOF.    Time 8    Period Weeks    Status New    Target Date 02/08/20                  Plan - 12/14/19 0932    Clinical Impression Statement Patient is a 29 y.o. female who comes to clinic with complaints of low back pain, Rt hip pain with mobility, strength and movement coordination deficits that impair their ability to perform usual daily and recreational functional activities without increase difficulty/symptoms at this time.  Patient to benefit from skilled PT services to address impairments and limitations to improve to previous level of function without restriction secondary to condition.    Examination-Activity Limitations Sleep;Squat;Stand;Other    workouts   Examination-Participation Restrictions Community Activity;Other   workouts   Stability/Clinical Decision Making Stable/Uncomplicated    Clinical Decision Making Low    Rehab Potential Good    PT Frequency --   1-2x/week   PT Duration 8 weeks    PT Treatment/Interventions ADLs/Self Care Home Management;Electrical Stimulation;Iontophoresis 4mg /ml Dexamethasone;Moist Heat;Traction;Balance training;Therapeutic exercise;Therapeutic activities;Functional mobility training;Stair training;Gait training;Neuromuscular re-education;Ultrasound;Patient/family education;Manual techniques;Vasopneumatic Device;Taping;Dry needling;Passive range of motion;Spinal Manipulations;Joint Manipulations    PT Next Visit Plan Improve core stability, limit excessive lumbar extension in movement    PT Home Exercise Plan [id number]    Consulted and Agree with Plan of Care Patient           Patient will benefit from skilled therapeutic intervention in order to improve the following deficits and impairments:  Pain, Decreased strength, Increased muscle spasms, Impaired perceived functional ability, Impaired flexibility, Postural dysfunction, Decreased coordination, Decreased activity tolerance, Decreased endurance  Visit Diagnosis: Acute right-sided low back pain, unspecified whether sciatica present - Plan: PT plan of care cert/re-cert  Pain in right hip - Plan: PT plan of care cert/re-cert  Muscle weakness (generalized) - Plan: PT plan of care cert/re-cert     Problem List Patient Active Problem List   Diagnosis Date Noted  . Upper back pain 07/12/2019  . Hyperglycemia 07/12/2019  . Anxiety and depression 06/13/2019  . Low back pain 01/25/2019  . Allergic rhinitis 12/07/2016  . Wheezing 12/07/2016  . Obesity (BMI 30-39.9) 06/03/2016  . Headache 08/23/2015  . Head trauma 08/22/2015  . Syncope 08/22/2015  . Large breasts 03/15/2014  . Encounter for well adult exam with abnormal findings 02/11/2011    . Impaired glucose tolerance 02/11/2011  . Migraine 02/11/2011  . TONSILLITIS, CHRONIC 06/12/2009  . Backache 06/12/2009  . ANEMIA-IRON DEFICIENCY 10/30/2008  . Attention deficit hyperactivity disorder (ADHD) 10/30/2008  . Mild persistent asthma 10/30/2008  . MENORRHAGIA 10/30/2008    Barbaraann Rondo 12/14/2019, 10:19 AM  [facility] Physical Therapy [address] [place], [place], [address] Phone: [phone]   Fax:  [phone]  Name: Victoria Wade MRN: [id number] Date of Birth: 1990/05/31

## 2019-12-20 ENCOUNTER — Telehealth: Payer: Self-pay | Admitting: Rehabilitative and Restorative Service Providers"

## 2019-12-20 ENCOUNTER — Encounter: Payer: BC Managed Care – PPO | Admitting: Rehabilitative and Restorative Service Providers"

## 2019-12-20 NOTE — Telephone Encounter (Signed)
Pt. Indicated thinking appointment was for tomorrow.  Reminded of next appointment time.   Chyrel Masson, PT, DPT, OCS, ATC 12/20/19  2:02 PM

## 2019-12-27 ENCOUNTER — Encounter: Payer: BC Managed Care – PPO | Admitting: Physical Therapy

## 2019-12-31 NOTE — Progress Notes (Signed)
New Patient Note  RE: Victoria Wade MRN: [id number] DOB: 1990-05-24 Date of Office Visit: 01/01/2020  Referring provider: Corwin Levins, MD Primary care provider: Corwin Levins, MD  Chief Complaint: Allergic Reaction  History of Present Illness: I had the pleasure of seeing Victoria Wade for initial evaluation at the [facility] on 01/01/2020. She is a 29 y.o. female, who is referred here by Corwin Levins, MD for the evaluation of food allergy.  A few weeks ago patient had 2 slices of [employer]'s meat lover's pizza and within 30 minutes she had some facial erythema, throat itching - making it difficult to swallow, ear itching, hives on her arms. Patient took Benadryl 50mg  and symptoms improved within 2 hours. Patient had this type of pizza before with no issues.   The following day she still had itching on her extremities and throat. She took some additional benadryl for 2 more days which helped.  2 days after the initial reaction she had a quest protein bar - cookies and cream. This was the first time she had this particular protein bar. She developed itchy throat, itchy body after 1 bite. Symptoms improved after 3 hours taking benadryl.   No food allergies prior to this.  She has been avoiding the quest protein bar, pizza and bread. Usually does not eat much pork.  She does not have access to epinephrine autoinjector.  Past work up includes: none.  Dietary History: patient has been eating other foods including milk, eggs, sesame, shellfish, seafood, soy, meats - chicken, beef;  fruits and vegetables.  Patient has not had any peanuts, tree nuts, soy, wheat, tomatoes.  No recent tick bites.   Quest protein bar - cookies and cream    Assessment and Plan: Victoria Wade is a 29 y.o. female with: Adverse food reaction 2 weeks ago patient had allergic reaction after eating [employer]'s meat lovers pizza.  Took Benadryl 50 mg and symptoms eventually resolved  after 2 days.  2 days after initial reaction she also had a Quest cookies and cream protein bar.  This was the first time she had this particular bar and noticed some itchy throat and body after 1 bite.  Resolved after Benadryl.  No history of food allergies in the past.  Patient has not been avoiding quest protein bar, pizza, tomato, bread products.  No recent tick bites.  Today's skin testing showed: Negative to foods. Food allergen skin testing has excellent negative predictive value however there is still a small chance that the allergy exists. Therefore, we will investigate further with serum specific IgE levels and, if negative then schedule for open graded oral food challenge.  Continue to avoid red meat for now - concerned for alpha-gal allergy.  Avoid peanuts and tree nuts which were in the Quest protein bar.   Okay to reintroduce other foods back into diet one by one including tomatoes. Get bloodwork as below.   I have prescribed epinephrine injectable and demonstrated proper use. For mild symptoms you can take over the counter antihistamines such as Benadryl and monitor symptoms closely. If symptoms worsen or if you have severe symptoms including breathing issues, throat closure, significant swelling, whole body hives, severe diarrhea and vomiting, lightheadedness then inject epinephrine and seek immediate medical care afterwards.  Food action plan given.  Mild intermittent asthma without complication Only takes albuterol during weather changes and poor air quality.  No recent oral prednisone.  Today's spirometry was normal.  May use albuterol rescue inhaler 2 puffs every 4 to 6 hours as needed for shortness of breath, chest tightness, coughing, and wheezing. May use albuterol rescue inhaler 2 puffs 5 to 15 minutes prior to strenuous physical activities. Monitor frequency of use.   Other allergic rhinitis Rhinoconjunctivitis symptoms for 20+ years mainly during the springtime.   Uses Allegra with good benefit.  Nasal sprays caused epistaxis.  Today's skin testing showed: Positive to dust mites and mold.  Start environmental control measures as below.  May use over the counter antihistamines such as Zyrtec (cetirizine), Claritin (loratadine), Allegra (fexofenadine), or Xyzal (levocetirizine) daily as needed.  Return in about 1 year (around 12/31/2020).  Meds ordered this encounter  Medications  . EPINEPHrine (AUVI-Q) 0.3 mg/0.3 mL IJ SOAJ injection    Sig: Inject 0.3 mg into the muscle as needed for anaphylaxis.    Dispense:  1 each    Refill:  1    Lab Orders     Alpha-Gal Panel     IgE Nut Prof. w/Component Rflx  Other allergy screening: Asthma: yes Usually takes albuterol during weather changes and poor air quality. No oral prednisone recently.  Rhino conjunctivitis: yes  She reports symptoms of sneezing, nasal congestion, coughing, watery eyes. Symptoms have been going on for 20+ years. The symptoms are present mainly during the spring. She has used Careers adviser with fair improvement in symptoms. Nasal sprays cause epistaxis. Sinus infections: one. Previous work up includes: no. Medication allergy: yes  Latex caused hives Hymenoptera allergy: no Urticaria: no Eczema:no History of recurrent infections suggestive of immunodeficency: no  Diagnostics: Spirometry:  Tracings reviewed. Her effort: Good reproducible efforts. FVC: 3.10L FEV1: 2.81L, 99% predicted FEV1/FVC ratio: 91% Interpretation: Spirometry consistent with normal pattern.  Please see scanned spirometry results for details.  Skin Testing: Environmental allergy panel and select foods. Positive to dust mites and mold. Negative to foods. Results discussed with patient/family.  Airborne Adult Perc - 01/01/20 0936    Time Antigen Placed 0940    Allergen Manufacturer Waynette Buttery    Location Back    Number of Test 59    1. Control-Buffer 50% Glycerol Negative    2. Control-Histamine 1 mg/ml 2+     3. Albumin saline Negative    4. Bahia Negative    5. French Southern Territories Negative    6. Johnson Negative    7. Kentucky Blue Negative    8. Meadow Fescue Negative    9. Perennial Rye Negative    10. Sweet Vernal Negative    11. Timothy Negative    12. Cocklebur Negative    13. Burweed Marshelder Negative    14. Ragweed, short Negative    15. Ragweed, Giant Negative    16. Plantain,  English Negative    17. Lamb's Quarters Negative    18. Sheep Sorrell Negative    19. Rough Pigweed Negative    20. Marsh Elder, Rough Negative    21. Mugwort, Common Negative    22. Ash mix Negative    23. Birch mix Negative    24. Beech American Negative    25. Box, Elder Negative    26. Cedar, red Negative    27. Cottonwood, Guinea-Bissau Negative    28. Elm mix Negative    29. Hickory Negative    30. Maple mix Negative    31. Oak, Guinea-Bissau mix Negative    32. Pecan Pollen Negative    33. Pine mix Negative    34. Sycamore Eastern Negative  35. Walnut, Black Pollen Negative    36. Alternaria alternata 2+    37. Cladosporium Herbarum Negative    38. Aspergillus mix Negative    39. Penicillium mix Negative    40. Bipolaris sorokiniana (Helminthosporium) Negative    41. Drechslera spicifera (Curvularia) Negative    42. Mucor plumbeus Negative    43. Fusarium moniliforme Negative    44. Aureobasidium pullulans (pullulara) Negative    45. Rhizopus oryzae Negative    46. Botrytis cinera Negative    47. Epicoccum nigrum Negative    48. Phoma betae Negative    49. Candida Albicans Negative    50. Trichophyton mentagrophytes Negative    51. Mite, D Farinae  5,000 AU/ml 2+    52. Mite, D Pteronyssinus  5,000 AU/ml 2+    53. Cat Hair 10,000 BAU/ml Negative    54.  Dog Epithelia Negative    55. Mixed Feathers Negative    56. Horse Epithelia Negative    57. Cockroach, German Negative    58. Mouse Negative    59. Tobacco Leaf Negative          Food Adult Perc - 01/01/20 0900    Time Antigen Placed  0940    Allergen Manufacturer Waynette Buttery    Location Back    Number of allergen test 22    1. Peanut Negative    2. Soybean Negative    3. Wheat Negative    4. Sesame Negative    5. Milk, cow Negative    6. Egg White, Chicken Negative    7. Casein Negative    10. Cashew Negative    11. Pecan Food Negative    12. Walnut Food Negative    13. Almond Negative    14. Hazelnut Negative    15. [place] nut Negative    16. Coconut Negative    17. Pistachio Negative    37. Pork Negative    40. Beef Negative    42. Tomato Negative    53. Corn Negative    64. Chocolate/Cacao bean Negative    65. Karaya Gum Negative    66. Acacia (Arabic Gum) Negative           Past Medical History: Patient Active Problem List   Diagnosis Date Noted  . Adverse food reaction 01/01/2020  . Mild intermittent asthma without complication 01/01/2020  . Upper back pain 07/12/2019  . Hyperglycemia 07/12/2019  . Anxiety and depression 06/13/2019  . Low back pain 01/25/2019  . Other allergic rhinitis 12/07/2016  . Wheezing 12/07/2016  . Obesity (BMI 30-39.9) 06/03/2016  . Headache 08/23/2015  . Head trauma 08/22/2015  . Syncope 08/22/2015  . Large breasts 03/15/2014  . Encounter for well adult exam with abnormal findings 02/11/2011  . Impaired glucose tolerance 02/11/2011  . Migraine 02/11/2011  . TONSILLITIS, CHRONIC 06/12/2009  . Backache 06/12/2009  . ANEMIA-IRON DEFICIENCY 10/30/2008  . Attention deficit hyperactivity disorder (ADHD) 10/30/2008  . Mild persistent asthma 10/30/2008  . MENORRHAGIA 10/30/2008   Past Medical History:  Diagnosis Date  . ADHD 10/30/2008  . ANEMIA-IRON DEFICIENCY 10/30/2008  . ASTHMA 10/30/2008  . Asthma   . BACK PAIN 06/12/2009  . TONSILLITIS, CHRONIC 06/12/2009   Past Surgical History: Past Surgical History:  Procedure Laterality Date  . none     Medication List:  Current Outpatient Medications  Medication Sig Dispense Refill  . albuterol (VENTOLIN HFA) 108 (90  Base) MCG/ACT inhaler Inhale 2 puffs into the lungs every 6 (six)  hours as needed for wheezing or shortness of breath. 18 g 2  . JUNEL FE 1/20 1-20 MG-MCG tablet Take 1 tablet by mouth daily.    Marland Kitchen. OVER THE COUNTER MEDICATION Vitamin Code, Vitamin B12, Women's Probiotic, Fish oil, Fat Burner    . EPINEPHrine (AUVI-Q) 0.3 mg/0.3 mL IJ SOAJ injection Inject 0.3 mg into the muscle as needed for anaphylaxis. 1 each 1   No current facility-administered medications for this visit.   Allergies: Allergies  Allergen Reactions  . Latex Other (See Comments)   Social History: Social History   Socioeconomic History  . Marital status: Single    Spouse name: Not on file  . Number of children: Not on file  . Years of education: Not on file  . Highest education level: Not on file  Occupational History  . Not on file  Tobacco Use  . Smoking status: Never Smoker  . Smokeless tobacco: Never Used  . Tobacco comment: No Children  Substance and Sexual Activity  . Alcohol use: Yes    Comment: occ  . Drug use: No  . Sexual activity: Not on file  Other Topics Concern  . Not on file  Social History Narrative  . Not on file   Social Determinants of Health   Financial Resource Strain:   . Difficulty of Paying Living Expenses: Not on file  Food Insecurity:   . Worried About Programme researcher, broadcasting/film/videounning Out of Food in the Last Year: Not on file  . Ran Out of Food in the Last Year: Not on file  Transportation Needs:   . Lack of Transportation (Medical): Not on file  . Lack of Transportation (Non-Medical): Not on file  Physical Activity:   . Days of Exercise per Week: Not on file  . Minutes of Exercise per Session: Not on file  Stress:   . Feeling of Stress : Not on file  Social Connections:   . Frequency of Communication with Friends and Family: Not on file  . Frequency of Social Gatherings with Friends and Family: Not on file  . Attends Religious Services: Not on file  . Active Member of Clubs or Organizations:  Not on file  . Attends BankerClub or Organization Meetings: Not on file  . Marital Status: Not on file   Lives in a 30 year old home. Smoking: denies Occupation: Engineer, manufacturing systemsloan servicing  Environmental HistorySurveyor, minerals: Water Damage/mildew in the house: no Engineer, civil (consulting)Carpet in the family room: yes Carpet in the bedroom: yes Heating: kerosene Cooling: window Pet: yes 2 dogs x 11 yrs  Family History: Family History  Problem Relation Age of Onset  . Cancer Maternal Aunt   . Prostate cancer Maternal Uncle   . Diabetes Other        both side of family  . Hypertension Other        both side of family  . Stroke Other        Aunt  . Cancer Other        Uncle - Prostate   Problem                               Relation Asthma                                   Cousins  Allergic rhino conjunctivitis     Mother, father, siblings  Review of Systems  Constitutional: Negative for appetite change, chills, fever and unexpected weight change.  HENT: Negative for congestion and rhinorrhea.   Eyes: Negative for itching.  Respiratory: Negative for cough, chest tightness, shortness of breath and wheezing.   Cardiovascular: Negative for chest pain.  Gastrointestinal: Negative for abdominal pain.  Genitourinary: Negative for difficulty urinating.  Skin: Negative for rash.  Allergic/Immunologic: Positive for environmental allergies.  Neurological: Negative for headaches.   Objective: BP 110/70   Pulse 84   Temp (!) 97.2 F (36.2 C) (Temporal)   Resp 16   Ht  (1.651 m)   Wt 181 lb 12.8 oz (82.5 kg)   SpO2 98%   BMI 30.25 kg/m  Body mass index is 30.25 kg/m. Physical Exam Vitals and nursing note reviewed.  Constitutional:      Appearance: Normal appearance. She is well-developed.  HENT:     Head: Normocephalic and atraumatic.     Right Ear: External ear normal.     Left Ear: External ear normal.     Nose: Nose normal.     Mouth/Throat:     Mouth: Mucous membranes are moist.     Pharynx: Oropharynx is  clear.  Eyes:     Conjunctiva/sclera: Conjunctivae normal.  Cardiovascular:     Rate and Rhythm: Normal rate and regular rhythm.     Heart sounds: Normal heart sounds. No murmur heard.  No friction rub. No gallop.   Pulmonary:     Effort: Pulmonary effort is normal.     Breath sounds: Normal breath sounds. No wheezing, rhonchi or rales.  Abdominal:     Palpations: Abdomen is soft.  Musculoskeletal:     Cervical back: Neck supple.  Skin:    General: Skin is warm.     Findings: No rash.  Neurological:     Mental Status: She is alert and oriented to person, place, and time.  Psychiatric:        Behavior: Behavior normal.    The plan was reviewed with the patient/family, and all questions/concerned were addressed.  It was my pleasure to see Christeena today and participate in her care. Please feel free to contact me with any questions or concerns.  Sincerely,  Wyline Mood, DO Allergy & Immunology  [facility] office: [phone] [facility] office: [phone]

## 2020-01-01 ENCOUNTER — Encounter: Payer: Self-pay | Admitting: Allergy

## 2020-01-01 ENCOUNTER — Other Ambulatory Visit: Payer: Self-pay

## 2020-01-01 ENCOUNTER — Ambulatory Visit (INDEPENDENT_AMBULATORY_CARE_PROVIDER_SITE_OTHER): Payer: BC Managed Care – PPO | Admitting: Allergy

## 2020-01-01 VITALS — BP 110/70 | HR 84 | Temp 97.2°F | Resp 16 | Ht 65.0 in | Wt 181.8 lb

## 2020-01-01 DIAGNOSIS — J4520 Mild intermittent asthma, uncomplicated: Secondary | ICD-10-CM | POA: Diagnosis not present

## 2020-01-01 DIAGNOSIS — T781XXD Other adverse food reactions, not elsewhere classified, subsequent encounter: Secondary | ICD-10-CM

## 2020-01-01 DIAGNOSIS — T781XXA Other adverse food reactions, not elsewhere classified, initial encounter: Secondary | ICD-10-CM | POA: Insufficient documentation

## 2020-01-01 DIAGNOSIS — J3089 Other allergic rhinitis: Secondary | ICD-10-CM

## 2020-01-01 MED ORDER — EPINEPHRINE 0.3 MG/0.3ML IJ SOAJ: 0.3000 mg | INTRAMUSCULAR | 1 refills | Status: DC | PRN

## 2020-01-01 NOTE — Assessment & Plan Note (Signed)
Rhinoconjunctivitis symptoms for 20+ years mainly during the springtime.  Uses Allegra with good benefit.  Nasal sprays caused epistaxis.  Today's skin testing showed: Positive to dust mites and mold.  Start environmental control measures as below.  May use over the counter antihistamines such as Zyrtec (cetirizine), Claritin (loratadine), Allegra (fexofenadine), or Xyzal (levocetirizine) daily as needed.

## 2020-01-01 NOTE — Assessment & Plan Note (Addendum)
2 weeks ago patient had allergic reaction after eating [employer]'s meat lovers pizza.  Took Benadryl 50 mg and symptoms eventually resolved after 2 days.  2 days after initial reaction she also had a Quest cookies and cream protein bar.  This was the first time she had this particular bar and noticed some itchy throat and body after 1 bite.  Resolved after Benadryl.  No history of food allergies in the past.  Patient has not been avoiding quest protein bar, pizza, tomato, bread products.  No recent tick bites.  Today's skin testing showed: Negative to foods. Food allergen skin testing has excellent negative predictive value however there is still a small chance that the allergy exists. Therefore, we will investigate further with serum specific IgE levels and, if negative then schedule for open graded oral food challenge.  Continue to avoid red meat for now - concerned for alpha-gal allergy.  Avoid peanuts and tree nuts which were in the Quest protein bar.   Okay to reintroduce other foods back into diet one by one including tomatoes. Get bloodwork as below.   I have prescribed epinephrine injectable and demonstrated proper use. For mild symptoms you can take over the counter antihistamines such as Benadryl and monitor symptoms closely. If symptoms worsen or if you have severe symptoms including breathing issues, throat closure, significant swelling, whole body hives, severe diarrhea and vomiting, lightheadedness then inject epinephrine and seek immediate medical care afterwards.  Food action plan given.

## 2020-01-01 NOTE — Patient Instructions (Addendum)
Today's skin testing showed: Positive to dust mites and mold. Negative to foods. Results given.   Food:   Continue to avoid red meat for now - concerned for alpha-gal allergy.  Avoid peanuts and tree nuts.  Okay to reintroduce other foods back into diet one by one including tomatoes. Get bloodwork:  We are ordering labs, so please allow 1-2 weeks for the results to come back. With the newly implemented Cures Act, the labs might be visible to you at the same time that they become visible to me. However, I will not address the results until all of the results are back, so please be patient.  In the meantime, continue recommendations in your patient instructions, including avoidance measures (if applicable), until you hear from me.  I have prescribed epinephrine injectable and demonstrated proper use. For mild symptoms you can take over the counter antihistamines such as Benadryl and monitor symptoms closely. If symptoms worsen or if you have severe symptoms including breathing issues, throat closure, significant swelling, whole body hives, severe diarrhea and vomiting, lightheadedness then inject epinephrine and seek immediate medical care afterwards.  Food action plan given.   Environmental allergies  Start environmental control measures as below.  May use over the counter antihistamines such as Zyrtec (cetirizine), Claritin (loratadine), Allegra (fexofenadine), or Xyzal (levocetirizine) daily as needed.  Asthma:  Today's breathing test was normal.  May use albuterol rescue inhaler 2 puffs every 4 to 6 hours as needed for shortness of breath, chest tightness, coughing, and wheezing. May use albuterol rescue inhaler 2 puffs 5 to 15 minutes prior to strenuous physical activities. Monitor frequency of use.   Follow up in 12 months or sooner if needed.   Control of House Dust Mite Allergen . Dust mite allergens are a common trigger of allergy and asthma symptoms. While they can be  found throughout the house, these microscopic creatures thrive in warm, humid environments such as bedding, upholstered furniture and carpeting. . Because so much time is spent in the bedroom, it is essential to reduce mite levels there.  . Encase pillows, mattresses, and box springs in special allergen-proof fabric covers or airtight, zippered plastic covers.  . Bedding should be washed weekly in hot water (130 F) and dried in a hot dryer. Allergen-proof covers are available for comforters and pillows that can't be regularly washed.  Reyes Ivan the allergy-proof covers every few months. Minimize clutter in the bedroom. Keep pets out of the bedroom.  Marland Kitchen Keep humidity less than 50% by using a dehumidifier or air conditioning. You can buy a humidity measuring device called a hygrometer to monitor this.  . If possible, replace carpets with hardwood, linoleum, or washable area rugs. If that's not possible, vacuum frequently with a vacuum that has a HEPA filter. . Remove all upholstered furniture and non-washable window drapes from the bedroom. . Remove all non-washable stuffed toys from the bedroom.  Wash stuffed toys weekly. Mold Control . Mold and fungi can grow on a variety of surfaces provided certain temperature and moisture conditions exist.  . Outdoor molds grow on plants, decaying vegetation and soil. The major outdoor mold, Alternaria and Cladosporium, are found in very high numbers during hot and dry conditions. Generally, a late summer - fall peak is seen for common outdoor fungal spores. Rain will temporarily lower outdoor mold spore count, but counts rise rapidly when the rainy period ends. . The most important indoor molds are Aspergillus and Penicillium. Dark, humid and poorly ventilated basements are  ideal sites for mold growth. The next most common sites of mold growth are the bathroom and the kitchen. Outdoor (Seasonal) Mold Control . Use air conditioning and keep windows closed. . Avoid  exposure to decaying vegetation. Marland Kitchen Avoid leaf raking. . Avoid grain handling. . Consider wearing a face mask if working in moldy areas.  Indoor (Perennial) Mold Control  . Maintain humidity below 50%. . Get rid of mold growth on hard surfaces with water, detergent and, if necessary, 5% bleach (do not mix with other cleaners). Then dry the area completely. If mold covers an area more than 10 square feet, consider hiring an indoor environmental professional. . For clothing, washing with soap and water is best. If moldy items cannot be cleaned and dried, throw them away. . Remove sources e.g. contaminated carpets. . Repair and seal leaking roofs or pipes. Using dehumidifiers in damp basements may be helpful, but empty the water and clean units regularly to prevent mildew from forming. All rooms, especially basements, bathrooms and kitchens, require ventilation and cleaning to deter mold and mildew growth. Avoid carpeting on concrete or damp floors, and storing items in damp areas.

## 2020-01-01 NOTE — Assessment & Plan Note (Signed)
Only takes albuterol during weather changes and poor air quality.  No recent oral prednisone.  Today's spirometry was normal.  May use albuterol rescue inhaler 2 puffs every 4 to 6 hours as needed for shortness of breath, chest tightness, coughing, and wheezing. May use albuterol rescue inhaler 2 puffs 5 to 15 minutes prior to strenuous physical activities. Monitor frequency of use.

## 2020-01-02 ENCOUNTER — Encounter: Payer: BC Managed Care – PPO | Admitting: Rehabilitative and Restorative Service Providers"

## 2020-01-07 ENCOUNTER — Encounter: Payer: BC Managed Care – PPO | Admitting: Rehabilitative and Restorative Service Providers"

## 2020-01-08 ENCOUNTER — Telehealth: Payer: Self-pay

## 2020-01-08 LAB — IGE NUT PROF. W/COMPONENT RFLX
F017-IgE Hazelnut (Filbert): <0.1 kU/L
F018-IgE Brazil Nut: <0.1 kU/L
F020-IgE Almond: <0.1 kU/L
F202-IgE Cashew Nut: <0.1 kU/L
F203-IgE Pistachio Nut: <0.1 kU/L
F256-IgE Walnut: <0.1 kU/L
Macadamia Nut, IgE: <0.1 kU/L
Peanut, IgE: <0.1 kU/L
Pecan Nut IgE: <0.1 kU/L

## 2020-01-08 LAB — ALPHA-GAL PANEL
Alpha Gal IgE*: <0.1 kU/L (ref <0.10)
Beef (Bos spp) IgE: <0.1 kU/L (ref <0.35)
Class Interpretation: 0
Class Interpretation: 0
Class Interpretation: 0
Lamb/Mutton (Ovis spp) IgE: <0.1 kU/L (ref <0.35)
Pork (Sus spp) IgE: <0.1 kU/L (ref <0.35)

## 2020-01-08 NOTE — Telephone Encounter (Signed)
Received notification from [facility] stating they have not been able to reach patient regarding Auvi-Q. I called and left a detailed message letting patient know that [facility] has been trying to contact her and to give them a call back at [phone].

## 2020-01-10 ENCOUNTER — Encounter: Payer: BC Managed Care – PPO | Admitting: Rehabilitative and Restorative Service Providers"

## 2020-01-14 ENCOUNTER — Encounter: Payer: BC Managed Care – PPO | Admitting: Rehabilitative and Restorative Service Providers"

## 2020-01-17 ENCOUNTER — Encounter: Payer: BC Managed Care – PPO | Admitting: Rehabilitative and Restorative Service Providers"

## 2020-06-04 ENCOUNTER — Encounter: Payer: Self-pay | Admitting: Internal Medicine

## 2020-06-04 MED ORDER — TERCONAZOLE 0.4 % VA CREA: 1.0000 | TOPICAL_CREAM | Freq: Every day | VAGINAL | 0 refills | Status: DC

## 2020-11-11 ENCOUNTER — Telehealth (INDEPENDENT_AMBULATORY_CARE_PROVIDER_SITE_OTHER): Payer: BC Managed Care – PPO | Admitting: Family Medicine

## 2020-11-11 ENCOUNTER — Encounter: Payer: Self-pay | Admitting: Family Medicine

## 2020-11-11 DIAGNOSIS — R0981 Nasal congestion: Secondary | ICD-10-CM | POA: Diagnosis not present

## 2020-11-11 DIAGNOSIS — H938X3 Other specified disorders of ear, bilateral: Secondary | ICD-10-CM

## 2020-11-11 NOTE — Patient Instructions (Signed)
  HOME CARE TIPS:  -[facility] COVID19 testing information:  Most pharmacies also offer testing and home test kits. If the Covid19 test is positive, please make a prompt follow up visit with your primary care office or with [facility] to discuss treatment options. Treatments for Covid19 are best given early in the course of the illness.   -Start Afrin nasal spray, use for 3 days and then stop.  -Start an allergy regimen of Zyrtec or Allegra once daily along with Flonase 2 sprays each nostril daily for 3 weeks.  -Can use nasal saline to flush the sinuses twice daily prior to using her nasal sprays.  -can use tylenol or aleve if needed for fevers, aches and pains per instructions  -stay hydrated, drink plenty of fluids and eat small healthy meals - avoid dairy  -can take 1000 IU ( ) Vit D3 and 100-500 mg of Vit C daily per instructions  -If the Covid test is positive, check out the [facility] website for more information on home care, transmission and treatment for COVID19  -follow up with your doctor in 2-3 days unless improving and feeling better  -stay home while sick, except to seek medical care. If the 2nd COVID19 test is positive, ideally it would be best to stay home for a full 10 days since the onset of symptoms.  It was nice to meet you today, and I really hope you are feeling better soon. I help  out with telemedicine visits on Tuesdays and Thursdays and am available for visits on those days. If you have any concerns or questions following this visit please schedule a follow up visit with your Primary Care doctor or seek care at a local urgent care clinic to avoid delays in care.    Seek in person care or schedule a follow up video visit promptly if your symptoms worsen, new concerns arise or you are not improving with treatment. Call 911 and/or seek emergency care if your symptoms are severe or life threatening.

## 2020-11-11 NOTE — Progress Notes (Signed)
Virtual Visit via Video Note  I connected with Victoria Wade  on 11/11/20 at  6:00 PM EDT by a video enabled telemedicine application and verified that I am speaking with the correct person using two identifiers.  Location patient: home, [place] Location provider:work or home office Persons participating in the virtual visit: patient, provider  I discussed the limitations of evaluation and management by telemedicine and the availability of in person appointments. The patient expressed understanding and agreed to proceed.   HPI:  Acute telemedicine visit for sinus issue: -Onset: the last 3-4 days - did a covid test today which was negative -Symptoms include: sinus congestion, PND, ears feel full -Denies:fevers, CP, SOB, NVD,  severe headache -FDLMP on currently -Pertinent past medical history: seasonal allergies, asthma -Pertinent medication allergies: Allergies  Allergen Reactions   Latex Other (See Comments)   Immunization History  Administered Date(s) Administered   Influenza Whole 01/04/2008   Influenza,inj,Quad PF,6+ Mos 01/30/2013, 03/15/2014   PFIZER(Purple Top)SARS-COV-2 Vaccination 09/05/2019, 10/03/2019   Td 04/05/2002   Tdap 01/30/2013     ROS: See pertinent positives and negatives per HPI.  Past Medical History:  Diagnosis Date   ADHD 10/30/2008   ANEMIA-IRON DEFICIENCY 10/30/2008   ASTHMA 10/30/2008   Asthma    BACK PAIN 06/12/2009   TONSILLITIS, CHRONIC 06/12/2009    Past Surgical History:  Procedure Laterality Date   none       Current Outpatient Medications:    albuterol (VENTOLIN HFA) 108 (90 Base) MCG/ACT inhaler, Inhale 2 puffs into the lungs every 6 (six) hours as needed for wheezing or shortness of breath., Disp: 18 g, Rfl: 2   OVER THE COUNTER MEDICATION, Women's Probiotic, Fish oil, Black seed oil, Disp: , Rfl:   EXAM:  VITALS per patient if applicable:  GENERAL: alert, oriented, appears well and in no acute distress  HEENT: atraumatic, conjunttiva  clear, no obvious abnormalities on inspection of external nose and ears  NECK: normal movements of the head and neck  LUNGS: on inspection no signs of respiratory distress, breathing rate appears normal, no obvious gross SOB, gasping or wheezing  CV: no obvious cyanosis  MS: moves all visible extremities without noticeable abnormality  PSYCH/NEURO: pleasant and cooperative, no obvious depression or anxiety, speech and thought processing grossly intact  ASSESSMENT AND PLAN:  Discussed the following assessment and plan:  Nasal congestion  Sensation of fullness in both ears  -we discussed possible serious and likely etiologies, options for evaluation and workup, limitations of telemedicine visit vs in person visit, treatment, treatment risks and precautions. Pt prefers to treat via telemedicine empirically rather than in person at this moment.  Query viral upper respiratory illness, seasonal allergies, possible COVID-19 with false negative testing versus other.  Opted for treatment with nasal saline, a short course of nasal decongestant, and starting an allergy regimen of antihistamine and Flonase for 3 weeks.  She agrees to do a second COVID test tomorrow. Advised to seek prompt in person care if worsening, new symptoms arise, or if is not improving with treatment. Discussed options for inperson care if PCP office not available. Did let this patient know that I only do telemedicine on Tuesdays and Thursdays for [place]. Advised to schedule follow up visit with PCP or [facility] if any further questions or concerns to avoid delays in care.   I discussed the assessment and treatment plan with the patient. The patient was provided an opportunity to ask questions and all were answered. The patient agreed with the plan  and demonstrated an understanding of the instructions.     Victoria Kern, DO

## 2020-12-09 ENCOUNTER — Encounter: Payer: Self-pay | Admitting: Internal Medicine

## 2021-01-01 ENCOUNTER — Ambulatory Visit: Payer: BC Managed Care – PPO | Admitting: Allergy

## 2021-01-01 DIAGNOSIS — J309 Allergic rhinitis, unspecified: Secondary | ICD-10-CM

## 2021-01-01 NOTE — Progress Notes (Deleted)
Follow Up Note  RE: Victoria Wade MRN: [id number] DOB: [birth year] Date of Office Visit: 01/01/2021  Referring provider: Corwin Levins, MD Primary care provider: Corwin Levins, MD  Chief Complaint: No chief complaint on file.  History of Present Illness: I had the pleasure of seeing Victoria Wade for a follow up visit at the [facility] on 01/01/2021. She is a 30 y.o. female, who is being followed for adverse food reaction, asthma and allergic rhinitis. Her previous allergy office visit was on 01/01/2020 with Dr. Selena Batten. Today is a regular follow up visit.   Victoria Wade,   Bloodwork was negative to red meat, peanuts and tree nuts.    I'm okay with you trying peanut at home as the protein bar was only processed in the facility that has peanuts.   However, if you want to reintroduce tree nuts back into your diet - I recommend doing in office food challenge for that as the protein bar had almonds in it. You must be off antihistamines for 3-5 days before. Must be in good health and not ill. Plan on being in the office for 2-3 hours and must bring in the food you want to do the oral challenge for - mixed tree nut butter from [employer] or almond milk. You must call to schedule an appointment and specify it's for a food challenge.    Regarding the red meat - have you eaten any pork, beef or lamb since the episodes?    Adverse food reaction 2 weeks ago patient had allergic reaction after eating [employer] meat lovers pizza.  Took Benadryl 50 mg and symptoms eventually resolved after 2 days.  2 days after initial reaction she also had a Quest cookies and cream protein bar.  This was the first time she had this particular bar and noticed some itchy throat and body after 1 bite.  Resolved after Benadryl.  No history of food allergies in the past.  Patient has not been avoiding quest protein bar, pizza, tomato, bread products.  No recent tick bites. Today's skin testing  showed: Negative to foods. Food allergen skin testing has excellent negative predictive value however there is still a small chance that the allergy exists. Therefore, we will investigate further with serum specific IgE levels and, if negative then schedule for open graded oral food challenge. Continue to avoid red meat for now - concerned for alpha-gal allergy. Avoid peanuts and tree nuts which were in the [employer] protein bar.  Okay to reintroduce other foods back into diet one by one including tomatoes. Get bloodwork as below.  I have prescribed epinephrine injectable and demonstrated proper use. For mild symptoms you can take over the counter antihistamines such as Benadryl and monitor symptoms closely. If symptoms worsen or if you have severe symptoms including breathing issues, throat closure, significant swelling, whole body hives, severe diarrhea and vomiting, lightheadedness then inject epinephrine and seek immediate medical care afterwards. Food action plan given.   Mild intermittent asthma without complication Only takes albuterol during weather changes and poor air quality.  No recent oral prednisone. Today's spirometry was normal. May use albuterol rescue inhaler 2 puffs every 4 to 6 hours as needed for shortness of breath, chest tightness, coughing, and wheezing. May use albuterol rescue inhaler 2 puffs 5 to 15 minutes prior to strenuous physical activities. Monitor frequency of use.    Other allergic rhinitis Rhinoconjunctivitis symptoms for 20+ years mainly during the springtime.  Uses  Allegra with good benefit.  Nasal sprays caused epistaxis. Today's skin testing showed: Positive to dust mites and mold. Start environmental control measures as below. May use over the counter antihistamines such as Zyrtec (cetirizine), Claritin (loratadine), Allegra (fexofenadine), or Xyzal (levocetirizine) daily as needed.   Return in about 1 year (around 12/31/2020).  Assessment and Plan: Victoria Wade  is a 30 y.o. female with: No problem-specific Assessment & Plan notes found for this encounter.  No follow-ups on file.  No orders of the defined types were placed in this encounter.  Lab Orders  No laboratory test(s) ordered today    Diagnostics: Spirometry:  Tracings reviewed. Her effort: {Blank single:19197::"Good reproducible efforts.","It was hard to get consistent efforts and there is a question as to whether this reflects a maximal maneuver.","Poor effort, data can not be interpreted."} FVC: ***L FEV1: ***L, ***% predicted FEV1/FVC ratio: ***% Interpretation: {Blank single:19197::"Spirometry consistent with mild obstructive disease","Spirometry consistent with moderate obstructive disease","Spirometry consistent with severe obstructive disease","Spirometry consistent with possible restrictive disease","Spirometry consistent with mixed obstructive and restrictive disease","Spirometry uninterpretable due to technique","Spirometry consistent with normal pattern","No overt abnormalities noted given today's efforts"}.  Please see scanned spirometry results for details.  Skin Testing: {Blank single:19197::"Select foods","Environmental allergy panel","Environmental allergy panel and select foods","Food allergy panel","None","Deferred due to recent antihistamines use"}. *** Results discussed with patient/family.   Medication List:  Current Outpatient Medications  Medication Sig Dispense Refill   albuterol (VENTOLIN HFA) 108 (90 Base) MCG/ACT inhaler Inhale 2 puffs into the lungs every 6 (six) hours as needed for wheezing or shortness of breath. 18 g 2   OVER THE COUNTER MEDICATION Women's Probiotic, Fish oil, Black seed oil     No current facility-administered medications for this visit.   Allergies: Allergies  Allergen Reactions   Latex Other (See Comments)   I reviewed her past medical history, social history, family history, and environmental history and no significant changes  have been reported from her previous visit.  Review of Systems  Constitutional:  Negative for appetite change, chills, fever and unexpected weight change.  HENT:  Negative for congestion and rhinorrhea.   Eyes:  Negative for itching.  Respiratory:  Negative for cough, chest tightness, shortness of breath and wheezing.   Cardiovascular:  Negative for chest pain.  Gastrointestinal:  Negative for abdominal pain.  Genitourinary:  Negative for difficulty urinating.  Skin:  Negative for rash.  Allergic/Immunologic: Positive for environmental allergies.  Neurological:  Negative for headaches.   Objective: There were no vitals taken for this visit. There is no height or weight on file to calculate BMI. Physical Exam Vitals and nursing note reviewed.  Constitutional:      Appearance: Normal appearance. She is well-developed.  HENT:     Head: Normocephalic and atraumatic.     Right Ear: External ear normal.     Left Ear: External ear normal.     Nose: Nose normal.     Mouth/Throat:     Mouth: Mucous membranes are moist.     Pharynx: Oropharynx is clear.  Eyes:     Conjunctiva/sclera: Conjunctivae normal.  Cardiovascular:     Rate and Rhythm: Normal rate and regular rhythm.     Heart sounds: Normal heart sounds. No murmur heard.   No friction rub. No gallop.  Pulmonary:     Effort: Pulmonary effort is normal.     Breath sounds: Normal breath sounds. No wheezing, rhonchi or rales.  Abdominal:     Palpations: Abdomen is soft.  Musculoskeletal:  Cervical back: Neck supple.  Skin:    General: Skin is warm.     Findings: No rash.  Neurological:     Mental Status: She is alert and oriented to person, place, and time.  Psychiatric:        Behavior: Behavior normal.   Previous notes and tests were reviewed. The plan was reviewed with the patient/family, and all questions/concerned were addressed.  It was my pleasure to see Victoria Wade today and participate in her care. Please feel  free to contact me with any questions or concerns.  Sincerely,  Wyline Mood, DO Allergy & Immunology  [facility] office: [phone] [facility] office: [phone]

## 2021-01-26 ENCOUNTER — Ambulatory Visit (INDEPENDENT_AMBULATORY_CARE_PROVIDER_SITE_OTHER): Payer: BC Managed Care – PPO | Admitting: Internal Medicine

## 2021-01-26 ENCOUNTER — Other Ambulatory Visit: Payer: Self-pay

## 2021-01-26 ENCOUNTER — Encounter: Payer: Self-pay | Admitting: Internal Medicine

## 2021-01-26 VITALS — BP 102/68 | HR 111 | Temp 98.7°F | Ht 65.0 in | Wt 165.0 lb

## 2021-01-26 DIAGNOSIS — Z0001 Encounter for general adult medical examination with abnormal findings: Secondary | ICD-10-CM

## 2021-01-26 DIAGNOSIS — F419 Anxiety disorder, unspecified: Secondary | ICD-10-CM | POA: Diagnosis not present

## 2021-01-26 DIAGNOSIS — E538 Deficiency of other specified B group vitamins: Secondary | ICD-10-CM

## 2021-01-26 DIAGNOSIS — E559 Vitamin D deficiency, unspecified: Secondary | ICD-10-CM

## 2021-01-26 DIAGNOSIS — D509 Iron deficiency anemia, unspecified: Secondary | ICD-10-CM | POA: Diagnosis not present

## 2021-01-26 DIAGNOSIS — F32A Depression, unspecified: Secondary | ICD-10-CM

## 2021-01-26 DIAGNOSIS — R739 Hyperglycemia, unspecified: Secondary | ICD-10-CM | POA: Diagnosis not present

## 2021-01-26 DIAGNOSIS — J4530 Mild persistent asthma, uncomplicated: Secondary | ICD-10-CM

## 2021-01-26 DIAGNOSIS — Z1159 Encounter for screening for other viral diseases: Secondary | ICD-10-CM

## 2021-01-26 LAB — URINALYSIS, ROUTINE W REFLEX MICROSCOPIC
Bilirubin Urine: NEGATIVE
Hgb urine dipstick: NEGATIVE
Ketones, ur: NEGATIVE
Leukocytes,Ua: NEGATIVE
Nitrite: NEGATIVE
RBC / HPF: NONE SEEN (ref 0–?)
Specific Gravity, Urine: 1.02 (ref 1.000–1.030)
Total Protein, Urine: NEGATIVE
Urine Glucose: NEGATIVE
Urobilinogen, UA: 0.2 (ref 0.0–1.0)
pH: 7 (ref 5.0–8.0)

## 2021-01-26 LAB — CBC WITH DIFFERENTIAL/PLATELET
Basophils Absolute: 0 10*3/uL (ref 0.0–0.1)
Basophils Relative: 0.4 % (ref 0.0–3.0)
Eosinophils Absolute: 0.1 10*3/uL (ref 0.0–0.7)
Eosinophils Relative: 1.6 % (ref 0.0–5.0)
HCT: 37.3 % (ref 36.0–46.0)
Hemoglobin: 12.3 g/dL (ref 12.0–15.0)
Lymphocytes Relative: 34.9 % (ref 12.0–46.0)
Lymphs Abs: 1.7 10*3/uL (ref 0.7–4.0)
MCHC: 33 g/dL (ref 30.0–36.0)
MCV: 88.7 fl (ref 78.0–100.0)
Monocytes Absolute: 0.4 10*3/uL (ref 0.1–1.0)
Monocytes Relative: 8.7 % (ref 3.0–12.0)
Neutro Abs: 2.6 10*3/uL (ref 1.4–7.7)
Neutrophils Relative %: 54.4 % (ref 43.0–77.0)
Platelets: 233 10*3/uL (ref 150.0–400.0)
RBC: 4.21 Mil/uL (ref 3.87–5.11)
RDW: 13.4 % (ref 11.5–15.5)
WBC: 4.8 10*3/uL (ref 4.0–10.5)

## 2021-01-26 LAB — IBC PANEL
Iron: 98 ug/dL (ref 42–145)
Saturation Ratios: 25.2 % (ref 20.0–50.0)
TIBC: 389.2 ug/dL (ref 250.0–450.0)
Transferrin: 278 mg/dL (ref 212.0–360.0)

## 2021-01-26 LAB — LIPID PANEL
Cholesterol: 148 mg/dL (ref 0–200)
HDL: 49.2 mg/dL (ref >39.00)
LDL Cholesterol: 90 mg/dL (ref 0–99)
NonHDL: 98.79
Total CHOL/HDL Ratio: 3
Triglycerides: 44 mg/dL (ref 0.0–149.0)
VLDL: 8.8 mg/dL (ref 0.0–40.0)

## 2021-01-26 LAB — HEPATIC FUNCTION PANEL
ALT: 16 U/L (ref 0–35)
AST: 20 U/L (ref 0–37)
Albumin: 4.6 g/dL (ref 3.5–5.2)
Alkaline Phosphatase: 60 U/L (ref 39–117)
Bilirubin, Direct: 0.1 mg/dL (ref 0.0–0.3)
Total Bilirubin: 0.6 mg/dL (ref 0.2–1.2)
Total Protein: 7.7 g/dL (ref 6.0–8.3)

## 2021-01-26 LAB — TSH: TSH: 4.55 u[IU]/mL (ref 0.35–5.50)

## 2021-01-26 LAB — BASIC METABOLIC PANEL
BUN: 11 mg/dL (ref 6–23)
CO2: 25 mEq/L (ref 19–32)
Calcium: 9.4 mg/dL (ref 8.4–10.5)
Chloride: 102 mEq/L (ref 96–112)
Creatinine, Ser: 0.8 mg/dL (ref 0.40–1.20)
GFR: 98.46 mL/min (ref >60.00)
Glucose, Bld: 85 mg/dL (ref 70–99)
Potassium: 3.7 mEq/L (ref 3.5–5.1)
Sodium: 136 mEq/L (ref 135–145)

## 2021-01-26 LAB — VITAMIN D 25 HYDROXY (VIT D DEFICIENCY, FRACTURES): VITD: 36.2 ng/mL (ref 30.00–100.00)

## 2021-01-26 LAB — FERRITIN: Ferritin: 21.9 ng/mL (ref 10.0–291.0)

## 2021-01-26 LAB — HEMOGLOBIN A1C: Hgb A1c MFr Bld: 5.5 % (ref 4.6–6.5)

## 2021-01-26 LAB — VITAMIN B12: Vitamin B-12: 312 pg/mL (ref 211–911)

## 2021-01-26 MED ORDER — CITALOPRAM HYDROBROMIDE 20 MG PO TABS: 20.0000 mg | ORAL_TABLET | Freq: Every day | ORAL | 3 refills | Status: DC

## 2021-01-26 MED ORDER — CITALOPRAM HYDROBROMIDE 20 MG PO TABS: 20.0000 mg | ORAL_TABLET | Freq: Every day | ORAL | 3 refills | Status: AC

## 2021-01-26 NOTE — Patient Instructions (Addendum)
Please consider the Novavax covid vaccine at some point  Please take all new medication as prescribed - the celexa 20 mg per day  You will be contacted regarding the referral for: counseling  You are given the work note today AND the FMLA will be faxed as soon as possble  Please continue all other medications as before, and refills have been done if requested.  Please have the pharmacy call with any other refills you may need.  Please continue your efforts at being more active, low cholesterol diet, and weight control.  You are otherwise up to date with prevention measures today.  Please keep your appointments with your specialists as you may have planned  Please go to the LAB at the blood drawing area for the tests to be done  You will be contacted by phone if any changes need to be made immediately.  Otherwise, you will receive a letter about your results with an explanation, but please check with MyChart first.  Please remember to sign up for MyChart if you have not done so, as this will be important to you in the future with finding out test results, communicating by private email, and scheduling acute appointments online when needed.  Please make an Appointment to return in 6 months, or sooner if needed

## 2021-01-26 NOTE — Progress Notes (Signed)
Patient ID: Victoria Wade, female   DOB: [birth year], 30 y.o.   MRN: [id number]         Chief Complaint:: wellness exam and anxiety/depression with occasional panic and recent wt loss       HPI:  Victoria Wade is a 30 y.o. female here for wellness exam, declines covid vax, flu shot, pneuomvax o/w up to date                        Also never had covid infection so far.  Had initial 2 covid shots, does not want booster due to feeling bad about the second shot.  Pt denies chest pain, increased sob or doe, wheezing, orthopnea, PND, increased LE swelling, palpitations, dizziness or syncope.   Pt denies polydipsia, polyuria, or new focal neuro s/s. But has had mod to severe worsening depressive symptoms without suicidal ideation, but with marked increased in overall anxiety which is the worse part with occasional panic. Overwhelmed at work, and simply cannot go back, as all other coworkers have quit and she is only one left, doing much of all the work.  Also Gpa has metastatic prostate ca and she is trying to spend more time there.    Pt denies fever, night sweats, loss of appetite, or other constitutional symptoms, except for wt loss and fatigue with worsening anxiety Wt Readings from Last 3 Encounters:  01/26/21 165 lb (74.8 kg)  01/01/20 181 lb 12.8 oz (82.5 kg)  11/28/19 179 lb 9.6 oz (81.5 kg)   BP Readings from Last 3 Encounters:  01/26/21 102/68  01/01/20 110/70  11/28/19 102/78   Immunization History  Administered Date(s) Administered   Influenza Whole 01/04/2008   Influenza,inj,Quad PF,6+ Mos 01/30/2013, 03/15/2014   PFIZER(Purple Top)SARS-COV-2 Vaccination 09/05/2019, 10/03/2019   Td 04/05/2002   Tdap 01/30/2013   There are no preventive care reminders to display for this patient.     Past Medical History:  Diagnosis Date   ADHD 10/30/2008   ANEMIA-IRON DEFICIENCY 10/30/2008   ASTHMA 10/30/2008   Asthma    BACK PAIN 06/12/2009   TONSILLITIS, CHRONIC 06/12/2009   Past  Surgical History:  Procedure Laterality Date   none      reports that she has never smoked. She has never used smokeless tobacco. She reports current alcohol use. She reports that she does not use drugs. family history includes Cancer in her maternal aunt and another family member; Diabetes in an other family member; Hypertension in an other family member; Prostate cancer in her maternal uncle; Stroke in an other family member. Allergies  Allergen Reactions   Latex Other (See Comments)   Current Outpatient Medications on File Prior to Visit  Medication Sig Dispense Refill   albuterol (VENTOLIN HFA) 108 (90 Base) MCG/ACT inhaler Inhale 2 puffs into the lungs every 6 (six) hours as needed for wheezing or shortness of breath. 18 g 2   OVER THE COUNTER MEDICATION Women's Probiotic, Fish oil, Black seed oil     No current facility-administered medications on file prior to visit.        ROS:  All others reviewed and negative.  Objective        PE:  BP 102/68 (BP Location: Left Arm, Patient Position: Sitting, Cuff Size: Large)   Pulse (!) 111   Temp 98.7 F (37.1 C) (Oral)   Ht 5\' 5"  (1.651 m)   Wt 165 lb (74.8 kg)   LMP 01/01/2021   SpO2  98%   BMI 27.46 kg/m                 Constitutional: Pt appears in NAD               HENT: Head: NCAT.                Right Ear: External ear normal.                 Left Ear: External ear normal.                Eyes: . Pupils are equal, round, and reactive to light. Conjunctivae and EOM are normal               Nose: without d/c or deformity               Neck: Neck supple. Gross normal ROM               Cardiovascular: Normal rate and regular rhythm.                 Pulmonary/Chest: Effort normal and breath sounds without rales or wheezing.                Abd:  Soft, NT, ND, + BS, no organomegaly               Neurological: Pt is alert. At baseline orientation, motor grossly intact               Skin: Skin is warm. No rashes, no other new  lesions, LE edema - none               Psychiatric: Pt behavior is normal without agitation , but mod to severe anxiety, depressed affect  Micro: none  Cardiac tracings I have personally interpreted today:  none  Pertinent Radiological findings (summarize): none   Lab Results  Component Value Date   WBC 4.8 01/26/2021   HGB 12.3 01/26/2021   HCT 37.3 01/26/2021   PLT 233.0 01/26/2021   GLUCOSE 85 01/26/2021   CHOL 148 01/26/2021   TRIG 44.0 01/26/2021   HDL 49.20 01/26/2021   LDLCALC 90 01/26/2021   ALT 16 01/26/2021   AST 20 01/26/2021   NA 136 01/26/2021   K 3.7 01/26/2021   CL 102 01/26/2021   CREATININE 0.80 01/26/2021   BUN 11 01/26/2021   CO2 25 01/26/2021   TSH 4.55 01/26/2021   HGBA1C 5.5 01/26/2021   Assessment/Plan:  SHAYLEIGH BOULDIN is a 30 y.o. Other or two or more races [6] White or Caucasian [1] female with  has a past medical history of ADHD (10/30/2008), ANEMIA-IRON DEFICIENCY (10/30/2008), ASTHMA (10/30/2008), Asthma, BACK PAIN (06/12/2009), and TONSILLITIS, CHRONIC (06/12/2009).  Encounter for well adult exam with abnormal findings Age and sex appropriate education and counseling updated with regular exercise and diet Referrals for preventative services - none needed Immunizations addressed - declines covid vax, flu shot, pneumovax Smoking counseling  - none needed Evidence for depression or other mood disorder - marked increase anxiety/panic/depression Most recent labs reviewed. I have personally reviewed and have noted: 1) the patient's medical and social history 2) The patient's current medications and supplements 3) The patient's height, weight, and BMI have been recorded in the chart   [facility] DEFICIENCY Lab Results  Component Value Date   WBC 4.8 01/26/2021   HGB 12.3 01/26/2021   HCT 37.3 01/26/2021   MCV 88.7 01/26/2021   PLT  233.0 01/26/2021  improved,  to f/u any worsening symptoms or concerns  Mild persistent asthma Overall  stable, to continue current med tx - albuterol hfa prn  Hyperglycemia Lab Results  Component Value Date   HGBA1C 5.5 01/26/2021   Stable, pt to continue current medical treatment  - diet   Anxiety and depression With severe psychiatric symptoms today, ok for note for out of work oct 27 - jan 9,, start celexa 10 qd, refer counseling  Vitamin D deficiency Last vitamin D Lab Results  Component Value Date   VD25OH 36.20 01/26/2021   Stable, cont oral replacement  Followup: Return in about 6 months (around 07/27/2021).  Oliver Barre, MD 02/03/2021 11:22 AM [facility]

## 2021-01-27 ENCOUNTER — Encounter: Payer: Self-pay | Admitting: Internal Medicine

## 2021-01-27 LAB — HEPATITIS C ANTIBODY
Hepatitis C Ab: NONREACTIVE
SIGNAL TO CUT-OFF: 0.04 (ref <1.00)

## 2021-01-29 ENCOUNTER — Encounter: Payer: BC Managed Care – PPO | Admitting: Internal Medicine

## 2021-01-29 NOTE — Telephone Encounter (Signed)
Notified patient of paperwork and she will pick up a copy

## 2021-02-03 ENCOUNTER — Encounter: Payer: Self-pay | Admitting: Internal Medicine

## 2021-02-03 DIAGNOSIS — E559 Vitamin D deficiency, unspecified: Secondary | ICD-10-CM | POA: Insufficient documentation

## 2021-02-03 NOTE — Assessment & Plan Note (Signed)
Overall stable, to continue current med tx - albuterol hfa prn

## 2021-02-03 NOTE — Assessment & Plan Note (Signed)
Lab Results  Component Value Date   HGBA1C 5.5 01/26/2021   Stable, pt to continue current medical treatment  - diet  

## 2021-02-03 NOTE — Assessment & Plan Note (Signed)
With severe psychiatric symptoms today, ok for note for out of work oct 27 - jan 9,, start celexa 10 qd, refer counseling

## 2021-02-03 NOTE — Assessment & Plan Note (Signed)
Lab Results  Component Value Date   WBC 4.8 01/26/2021   HGB 12.3 01/26/2021   HCT 37.3 01/26/2021   MCV 88.7 01/26/2021   PLT 233.0 01/26/2021  improved,  to f/u any worsening symptoms or concerns

## 2021-02-03 NOTE — Assessment & Plan Note (Signed)
Last vitamin D Lab Results  Component Value Date   VD25OH 36.20 01/26/2021   Stable, cont oral replacement

## 2021-02-03 NOTE — Assessment & Plan Note (Signed)
Age and sex appropriate education and counseling updated with regular exercise and diet Referrals for preventative services - none needed Immunizations addressed - declines covid vax, flu shot, pneumovax Smoking counseling  - none needed Evidence for depression or other mood disorder - marked increase anxiety/panic/depression Most recent labs reviewed. I have personally reviewed and have noted: 1) the patient's medical and social history 2) The patient's current medications and supplements 3) The patient's height, weight, and BMI have been recorded in the chart

## 2021-02-04 ENCOUNTER — Telehealth (INDEPENDENT_AMBULATORY_CARE_PROVIDER_SITE_OTHER): Payer: BC Managed Care – PPO | Admitting: Family Medicine

## 2021-02-04 ENCOUNTER — Encounter: Payer: Self-pay | Admitting: Family Medicine

## 2021-02-04 VITALS — Temp 100.0°F | Ht 65.0 in

## 2021-02-04 DIAGNOSIS — J069 Acute upper respiratory infection, unspecified: Secondary | ICD-10-CM

## 2021-02-04 DIAGNOSIS — R051 Acute cough: Secondary | ICD-10-CM

## 2021-02-04 LAB — POC INFLUENZA A&B (BINAX/QUICKVUE)
Influenza A, POC: NEGATIVE
Influenza B, POC: NEGATIVE

## 2021-02-04 MED ORDER — BENZONATATE 200 MG PO CAPS: 200.0000 mg | ORAL_CAPSULE | Freq: Two times a day (BID) | ORAL | 0 refills | Status: AC | PRN

## 2021-02-04 MED ORDER — HYDROCODONE BIT-HOMATROP MBR 5-1.5 MG/5ML PO SOLN: 5.0000 mL | Freq: Two times a day (BID) | ORAL | 0 refills | Status: AC | PRN

## 2021-02-04 NOTE — Progress Notes (Signed)
Virtual Visit via Video Note I connected with Victoria Wade on 02/04/21 by a video enabled telemedicine application and verified that I am speaking with the correct person using two identifiers.  Location patient: home Location provider:work or home office Persons participating in the virtual visit: patient, provider  I discussed the limitations of evaluation and management by telemedicine and the availability of in person appointments. The patient expressed understanding and agreed to proceed.  Chief Complaint  Patient presents with   Cough    & congestion, 2 negative covid tests. Sick since Sunday, had fever on Sunday, persistent. 100 today, Mucinex, ibuprofen, tylenol. Getting worse, chest hurts around her ribs when she breathes and coughs.   HPI: Victoria Wade is a 30 y.o. female with history of asthma, ADHD, allergic rhinitis,and migraines complaining of 3 days of respiratory symptoms as described above. +Fatigue, decreased appetite, fever, nasal congestion, rhinorrhea, sore throat, productive cough, and body aches. Negative for hemoptysis. Cough interferes with sleep.  No known sick contact or recent travel.  History of asthma, she has not noted wheezing. She has not used albuterol inh recently.  She has had some difficulty breathing when lying down. She had generalized headache yesterday, improved after taking ibuprofen.  Negative for anosmia, ageusia,palpitations, abdominal pain, nausea, vomiting, changes in bowel habits, urinary symptoms, or skin rash.  ROS: See pertinent positives and negatives per HPI.  Past Medical History:  Diagnosis Date   ADHD 10/30/2008   ANEMIA-IRON DEFICIENCY 10/30/2008   ASTHMA 10/30/2008   Asthma    BACK PAIN 06/12/2009   TONSILLITIS, CHRONIC 06/12/2009   Past Surgical History:  Procedure Laterality Date   none     Family History  Problem Relation Age of Onset   Cancer Maternal Aunt    Prostate cancer Maternal Uncle    Diabetes  Other        both side of family   Hypertension Other        both side of family   Stroke Other        Aunt   Cancer Other        Uncle - Prostate    Social History   Socioeconomic History   Marital status: Single    Spouse name: Not on file   Number of children: Not on file   Years of education: Not on file   Highest education level: Not on file  Occupational History   Not on file  Tobacco Use   Smoking status: Never   Smokeless tobacco: Never   Tobacco comments:    No Children  Substance and Sexual Activity   Alcohol use: Yes    Comment: occ   Drug use: No   Sexual activity: Not on file  Other Topics Concern   Not on file  Social History Narrative   Not on file   Social Determinants of Health   Financial Resource Strain: Not on file  Food Insecurity: Not on file  Transportation Needs: Not on file  Physical Activity: Not on file  Stress: Not on file  Social Connections: Not on file  Intimate Partner Violence: Not on file   Current Outpatient Medications:    albuterol (VENTOLIN HFA) 108 (90 Base) MCG/ACT inhaler, Inhale 2 puffs into the lungs every 6 (six) hours as needed for wheezing or shortness of breath., Disp: 18 g, Rfl: 2   citalopram (CELEXA) 20 MG tablet, Take 1 tablet (20 mg total) by mouth daily., Disp: 90 tablet, Rfl: 3  OVER THE COUNTER MEDICATION, Women's Probiotic, Fish oil, Black seed oil, Disp: , Rfl:   EXAM:  VITALS per patient if applicable:Temp 100 F (37.8 C) (Oral)   Ht 5\' 5"  (1.651 m)   BMI 27.46 kg/m   GENERAL: alert, oriented, appears well and in no acute distress  HEENT: atraumatic, conjunctiva clear, no obvious abnormalities on inspection of external nose and ears Mild dysphonia, no stridor.  NECK: normal movements of the head and neck  LUNGS: on inspection no signs of respiratory distress, breathing rate appears normal, no obvious gross SOB, gasping or wheezing  CV: no obvious cyanosis  MS: moves all visible  extremities without noticeable abnormality  PSYCH/NEURO: pleasant and cooperative, no obvious depression or anxiety, speech and thought processing grossly intact  ASSESSMENT AND PLAN:  Discussed the following assessment and plan:  URI, acute - Plan: POC Influenza A&B(BINAX/QUICKVUE) Home COVID test x2 negative. Rapid flu test done in her car, negative. Symptoms suggests a viral etiology,symptomatic treatment recommended , I do not think abx is needed at this time. Plenty of fluids, rest, Tylenol 500 mg every 4-6 hours, and she could not turn it with ibuprofen. States that she doe snot need a note for work. Contact precautions. Her mother is checking on her regularly.  Albuterol inh 2 puff every 6 hours for a week then as needed for wheezing or shortness of breath.  She has not tolerated intranasal medication in the past. Throat lozenges to help with sore throat.  Instructed to monitor for signs of complications, clearly instructed about warning signs.  F/U as needed.  Acute cough - Plan: benzonatate (TESSALON) 200 MG capsule, HYDROcodone bit-homatropine (HYCODAN) 5-1.5 MG/5ML syrup I also explained that cough and nasal congestion can last a few days and sometimes weeks. I do not think imaging is needed at this time.  I discussed the assessment and treatment plan with the patient. Ms. Arrellano was provided an opportunity to ask questions and all were answered. She agreed with the plan and demonstrated an understanding of the instructions.  Return if symptoms worsen or fail to improve.  Betty G. [name], MD  [facility]. [place] office.

## 2021-02-12 ENCOUNTER — Telehealth: Payer: Self-pay | Admitting: Internal Medicine

## 2021-02-12 NOTE — Telephone Encounter (Signed)
Patient requesting to pick up short term disability form  Please call patient when form is available for pick up

## 2021-02-12 NOTE — Telephone Encounter (Signed)
Patient requesting after visit summary for 01-26-2021 fax for short term disability  Fax [phone]

## 2021-02-12 NOTE — Telephone Encounter (Signed)
Form faxed with notes and patient notified to pick up paperwork

## 2021-02-19 ENCOUNTER — Ambulatory Visit (INDEPENDENT_AMBULATORY_CARE_PROVIDER_SITE_OTHER): Payer: BC Managed Care – PPO | Admitting: Nurse Practitioner

## 2021-02-19 ENCOUNTER — Ambulatory Visit (INDEPENDENT_AMBULATORY_CARE_PROVIDER_SITE_OTHER)
Admission: RE | Admit: 2021-02-19 | Discharge: 2021-02-19 | Disposition: A | Discharge status: Home / Self Care | Payer: BC Managed Care – PPO | Source: Ambulatory Visit | Attending: Nurse Practitioner | Admitting: Nurse Practitioner

## 2021-02-19 ENCOUNTER — Other Ambulatory Visit: Payer: Self-pay

## 2021-02-19 ENCOUNTER — Encounter: Payer: Self-pay | Admitting: Nurse Practitioner

## 2021-02-19 ENCOUNTER — Telehealth: Payer: Self-pay | Admitting: Internal Medicine

## 2021-02-19 VITALS — BP 104/62 | HR 108 | Temp 98.0°F | Resp 10 | Ht 65.0 in | Wt 167.2 lb

## 2021-02-19 DIAGNOSIS — R0982 Postnasal drip: Secondary | ICD-10-CM | POA: Diagnosis not present

## 2021-02-19 DIAGNOSIS — R053 Chronic cough: Secondary | ICD-10-CM

## 2021-02-19 MED ORDER — ALBUTEROL SULFATE HFA 108 (90 BASE) MCG/ACT IN AERS: 2.0000 | INHALATION_SPRAY | Freq: Four times a day (QID) | RESPIRATORY_TRACT | 2 refills | Status: AC | PRN

## 2021-02-19 MED ORDER — FLUTICASONE PROPIONATE 50 MCG/ACT NA SUSP: 2.0000 | Freq: Every day | NASAL | 0 refills | Status: AC

## 2021-02-19 NOTE — Assessment & Plan Note (Signed)
Cough is been going on approximately 3 weeks did discuss with patient that viral coughs can last 4 to 6 weeks.  She is asthmatic will pick up her albuterol inhaler today states she googled her symptoms and said to be evaluated by Dr. If symptoms lasted 3 weeks or longer.  It has been improving we will obtain a chest x-ray just to be certain since she does have decreased breath sounds.  Continue to monitor.  May take prescribed regimens from previous provider if beneficial.  Did review signs symptoms when to seek emergent or urgent health care.

## 2021-02-19 NOTE — Patient Instructions (Signed)
Nice to see you Will be in touch with your xray results Sent medication to your pharmacy If not improving over the next week let us know

## 2021-02-19 NOTE — Progress Notes (Signed)
Acute Office Visit  Subjective:    Patient ID: Victoria Wade, female    DOB: 20-Oct-1990, 30 y.o.   MRN: [id number]  Chief Complaint  Patient presents with   Cough    Lingering since around 02/01/21 when she takes deep breaths or after working out it makes her cough, hoarse voice. Cough is dry and productive at times-clear phlegm. Has used inhaler, cough medications, OTC remedies.     Patient is in today for cough  Started on 02/01/2021.  States that she is still having cough and sore throat.  Cough has improved since inception. She has been using the medications prescribed from her virtual visit and they have helped.   States that the cough is worse at night but she is sleeping with her head elevated.  States [facility] with exercise, this was not happening prior to getting sick  No history of DVT, PE, or long periods of immobility    Lmp: 01/29/2021 Past Medical History:  Diagnosis Date   ADHD 10/30/2008   ANEMIA-IRON DEFICIENCY 10/30/2008   ASTHMA 10/30/2008   Asthma    BACK PAIN 06/12/2009   TONSILLITIS, CHRONIC 06/12/2009    Past Surgical History:  Procedure Laterality Date   none      Family History  Problem Relation Age of Onset   Cancer Maternal Aunt    Prostate cancer Maternal Uncle    Diabetes Other        both side of family   Hypertension Other        both side of family   Stroke Other        Aunt   Cancer Other        Uncle - Prostate    Social History   Socioeconomic History   Marital status: Single    Spouse name: Not on file   Number of children: Not on file   Years of education: Not on file   Highest education level: Not on file  Occupational History   Not on file  Tobacco Use   Smoking status: Never   Smokeless tobacco: Never   Tobacco comments:    No Children  Substance and Sexual Activity   Alcohol use: Yes    Comment: occ   Drug use: No   Sexual activity: Not on file  Other Topics Concern   Not on file  Social History  Narrative   Not on file   Social Determinants of Health   Financial Resource Strain: Not on file  Food Insecurity: Not on file  Transportation Needs: Not on file  Physical Activity: Not on file  Stress: Not on file  Social Connections: Not on file  Intimate Partner Violence: Not on file    Outpatient Medications Prior to Visit  Medication Sig Dispense Refill   albuterol (VENTOLIN HFA) 108 (90 Base) MCG/ACT inhaler Inhale 2 puffs into the lungs every 6 (six) hours as needed for wheezing or shortness of breath. 18 g 2   citalopram (CELEXA) 20 MG tablet Take 1 tablet (20 mg total) by mouth daily. 90 tablet 3   OVER THE COUNTER MEDICATION Women's Probiotic, Fish oil, Black seed oil     No facility-administered medications prior to visit.    Allergies  Allergen Reactions   Latex Other (See Comments)    Review of Systems  Constitutional:  Positive for fatigue. Negative for chills and fever.  HENT:  Positive for congestion, postnasal drip and sore throat. Negative for ear discharge and ear pain.  Respiratory:  Positive for cough (clear) and shortness of breath (with exertion.).   Cardiovascular:  Negative for chest pain.  Gastrointestinal:  Negative for abdominal pain, diarrhea, nausea and vomiting.  Neurological:  Negative for dizziness, light-headedness and headaches.      Objective:    Physical Exam Vitals and nursing note reviewed.  Constitutional:      Appearance: Normal appearance. She is not ill-appearing.  HENT:     Right Ear: Tympanic membrane, ear canal and external ear normal. There is no impacted cerumen.     Left Ear: Tympanic membrane, ear canal and external ear normal. There is no impacted cerumen.     Nose:     Right Sinus: No maxillary sinus tenderness or frontal sinus tenderness.     Left Sinus: No maxillary sinus tenderness or frontal sinus tenderness.     Mouth/Throat:     Mouth: Mucous membranes are moist.     Pharynx: Oropharynx is clear. No  pharyngeal swelling, oropharyngeal exudate or uvula swelling.     Comments: Cobblestoning  Eyes:     Extraocular Movements: Extraocular movements intact.     Pupils: Pupils are equal, round, and reactive to light.  Cardiovascular:     Rate and Rhythm: Regular rhythm. Tachycardia present.  Pulmonary:     Effort: Pulmonary effort is normal.     Comments: Decreased  Abdominal:     General: Bowel sounds are normal.  Lymphadenopathy:     Cervical: No cervical adenopathy.  Neurological:     Mental Status: She is alert.  Psychiatric:        Mood and Affect: Mood normal.        Behavior: Behavior normal.        Thought Content: Thought content normal.        Judgment: Judgment normal.    BP 104/62   Pulse (!) 108   Temp 98 F (36.7 C)   Resp 10   Ht 5\' 5"  (1.651 m)   Wt 167 lb 4 oz (75.9 kg)   LMP 01/29/2021   SpO2 99%   BMI 27.83 kg/m  Wt Readings from Last 3 Encounters:  02/19/21 167 lb 4 oz (75.9 kg)  01/26/21 165 lb (74.8 kg)  01/01/20 181 lb 12.8 oz (82.5 kg)    Health Maintenance Due  Topic Date Due   COVID-19 Vaccine (3 - Booster for Pfizer series) 11/28/2019    There are no preventive care reminders to display for this patient.   Lab Results  Component Value Date   TSH 4.55 01/26/2021   Lab Results  Component Value Date   WBC 4.8 01/26/2021   HGB 12.3 01/26/2021   HCT 37.3 01/26/2021   MCV 88.7 01/26/2021   PLT 233.0 01/26/2021   Lab Results  Component Value Date   NA 136 01/26/2021   K 3.7 01/26/2021   CO2 25 01/26/2021   GLUCOSE 85 01/26/2021   BUN 11 01/26/2021   CREATININE 0.80 01/26/2021   BILITOT 0.6 01/26/2021   ALKPHOS 60 01/26/2021   AST 20 01/26/2021   ALT 16 01/26/2021   PROT 7.7 01/26/2021   ALBUMIN 4.6 01/26/2021   CALCIUM 9.4 01/26/2021   GFR 98.46 01/26/2021   Lab Results  Component Value Date   CHOL 148 01/26/2021   Lab Results  Component Value Date   HDL 49.20 01/26/2021   Lab Results  Component Value Date    LDLCALC 90 01/26/2021   Lab Results  Component Value Date  TRIG 44.0 01/26/2021   Lab Results  Component Value Date   CHOLHDL 3 01/26/2021   Lab Results  Component Value Date   HGBA1C 5.5 01/26/2021       Assessment & Plan:   Problem List Items Addressed This Visit       Other   Persistent cough for 3 weeks or longer - Primary    Cough is been going on approximately 3 weeks did discuss with patient that viral coughs can last 4 to 6 weeks.  She is asthmatic will pick up her albuterol inhaler today states she googled her symptoms and said to be evaluated by Dr. If symptoms lasted 3 weeks or longer.  It has been improving we will obtain a chest x-ray just to be certain since she does have decreased breath sounds.  Continue to monitor.  May take prescribed regimens from previous provider if beneficial.  Did review signs symptoms when to seek emergent or urgent health care.      Relevant Orders   DG Chest 2 View   PND (post-nasal drip)    Some postnasal drip which I think is part of the increased cough when she lies down at night.  We will start her on some Flonase 2 sprays each nostril daily.  Did give warning of continued use can cause nosebleeds to use the nasal saline if that happens.  We will continue to monitor.  Signs and symptoms reviewed as to when to seek urgent or emergent health care.      Relevant Medications   fluticasone (FLONASE) 50 MCG/ACT nasal spray     No orders of the defined types were placed in this encounter.  This visit occurred during the SARS-CoV-2 public health emergency.  Safety protocols were in place, including screening questions prior to the visit, additional usage of staff PPE, and extensive cleaning of exam room while observing appropriate contact time as indicated for disinfecting solutions.   Audria Nine, NP

## 2021-02-19 NOTE — Telephone Encounter (Signed)
1.Medication Requested: albuterol (VENTOLIN HFA) 108 (90 Base) MCG/ACT inhaler  2. Pharmacy (Name, Street, [place]): [facility] [phone] - [place], [place] - [address]  3. On Med List: yes  4. Last Visit with PCP: 01-26-2021  5. Next visit date with PCP: 07-29-2021   Agent: Please be advised that RX refills may take up to 3 business days. We ask that you follow-up with your pharmacy.

## 2021-02-19 NOTE — Assessment & Plan Note (Signed)
Some postnasal drip which I think is part of the increased cough when she lies down at night.  We will start her on some Flonase 2 sprays each nostril daily.  Did give warning of continued use can cause nosebleeds to use the nasal saline if that happens.  We will continue to monitor.  Signs and symptoms reviewed as to when to seek urgent or emergent health care.

## 2021-07-27 ENCOUNTER — Ambulatory Visit: Payer: BC Managed Care – PPO | Admitting: Internal Medicine

## 2021-07-29 ENCOUNTER — Ambulatory Visit: Payer: BC Managed Care – PPO | Admitting: Internal Medicine

## 2022-05-06 ENCOUNTER — Ambulatory Visit (INDEPENDENT_AMBULATORY_CARE_PROVIDER_SITE_OTHER): Payer: BC Managed Care – PPO

## 2022-05-06 ENCOUNTER — Ambulatory Visit (INDEPENDENT_AMBULATORY_CARE_PROVIDER_SITE_OTHER): Payer: BC Managed Care – PPO | Admitting: Podiatry

## 2022-05-06 ENCOUNTER — Encounter: Payer: Self-pay | Admitting: Podiatry

## 2022-05-06 DIAGNOSIS — M7751 Other enthesopathy of right foot: Secondary | ICD-10-CM

## 2022-05-06 DIAGNOSIS — M205X1 Other deformities of toe(s) (acquired), right foot: Secondary | ICD-10-CM | POA: Diagnosis not present

## 2022-05-06 DIAGNOSIS — M79671 Pain in right foot: Secondary | ICD-10-CM

## 2022-05-06 MED ORDER — TRIAMCINOLONE ACETONIDE 10 MG/ML IJ SUSP
10.0000 mg | Freq: Once | INTRAMUSCULAR | Status: AC
  Administered 2022-05-06: 10 mg

## 2022-05-06 MED ORDER — DICLOFENAC SODIUM 75 MG PO TBEC: 75.0000 mg | DELAYED_RELEASE_TABLET | Freq: Two times a day (BID) | ORAL | 2 refills | Status: AC

## 2022-05-10 NOTE — Progress Notes (Signed)
Subjective:   Patient ID: Victoria Wade, female   DOB: 32 y.o.   MRN: [id number]   HPI Patient presents stating that she has pain in the big toe joint right.  States that she has become a runner and that since she has been running she has noticed this and it is not all the time periodic but at times it can be bothersome   Review of Systems  All other systems reviewed and are negative.       Objective:  Physical Exam Vitals and nursing note reviewed.  Constitutional:      Appearance: She is well-developed.  Pulmonary:     Effort: Pulmonary effort is normal.  Musculoskeletal:        General: Normal range of motion.  Skin:    General: Skin is warm.  Neurological:     Mental Status: She is alert.     Neurovascular status intact with inflammation and pain of the first MPJ right with fluid buildup around the joint surface and no crepitus of the joint and motion that is consistent both of the right and left foot.  Good digital perfusion well-oriented x 3     Assessment:  Inflammatory capsulitis of the first MPJ which I do not think was due to any of the previous procedures with good alignment noted no crepitus of the joint     Plan:  H&P reviewed and I went ahead today did sterile prep and did do a periarticular injection around the first MPJ 3 mg dexamethasone Kenalog 5 mg Xylocaine advised on rigid bottom shoes reappoint to recheck

## 2022-05-27 ENCOUNTER — Ambulatory Visit: Payer: BC Managed Care – PPO | Admitting: Podiatry

## 2022-06-09 ENCOUNTER — Encounter: Payer: Self-pay | Admitting: Podiatry

## 2022-06-09 ENCOUNTER — Ambulatory Visit (INDEPENDENT_AMBULATORY_CARE_PROVIDER_SITE_OTHER): Payer: BC Managed Care – PPO | Admitting: Podiatry

## 2022-06-09 DIAGNOSIS — M7751 Other enthesopathy of right foot: Secondary | ICD-10-CM | POA: Diagnosis not present

## 2022-06-09 DIAGNOSIS — M7752 Other enthesopathy of left foot: Secondary | ICD-10-CM | POA: Diagnosis not present

## 2022-06-09 NOTE — Progress Notes (Signed)
Patient presents stating she is old still subjective:   Patient ID: Victoria Wade, female   DOB: 32 y.o.   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  Reappoint when returned

## 2022-06-22 ENCOUNTER — Other Ambulatory Visit: Payer: Self-pay | Admitting: Podiatry

## 2022-06-22 DIAGNOSIS — M205X1 Other deformities of toe(s) (acquired), right foot: Secondary | ICD-10-CM

## 2023-01-14 IMAGING — DX DG CHEST 2V
2 series · 2 of 2 positions shown · non-contrast
Comparison: None.

CLINICAL DATA: Cough

EXAM:
CHEST - 2 VIEW

[chest pa]
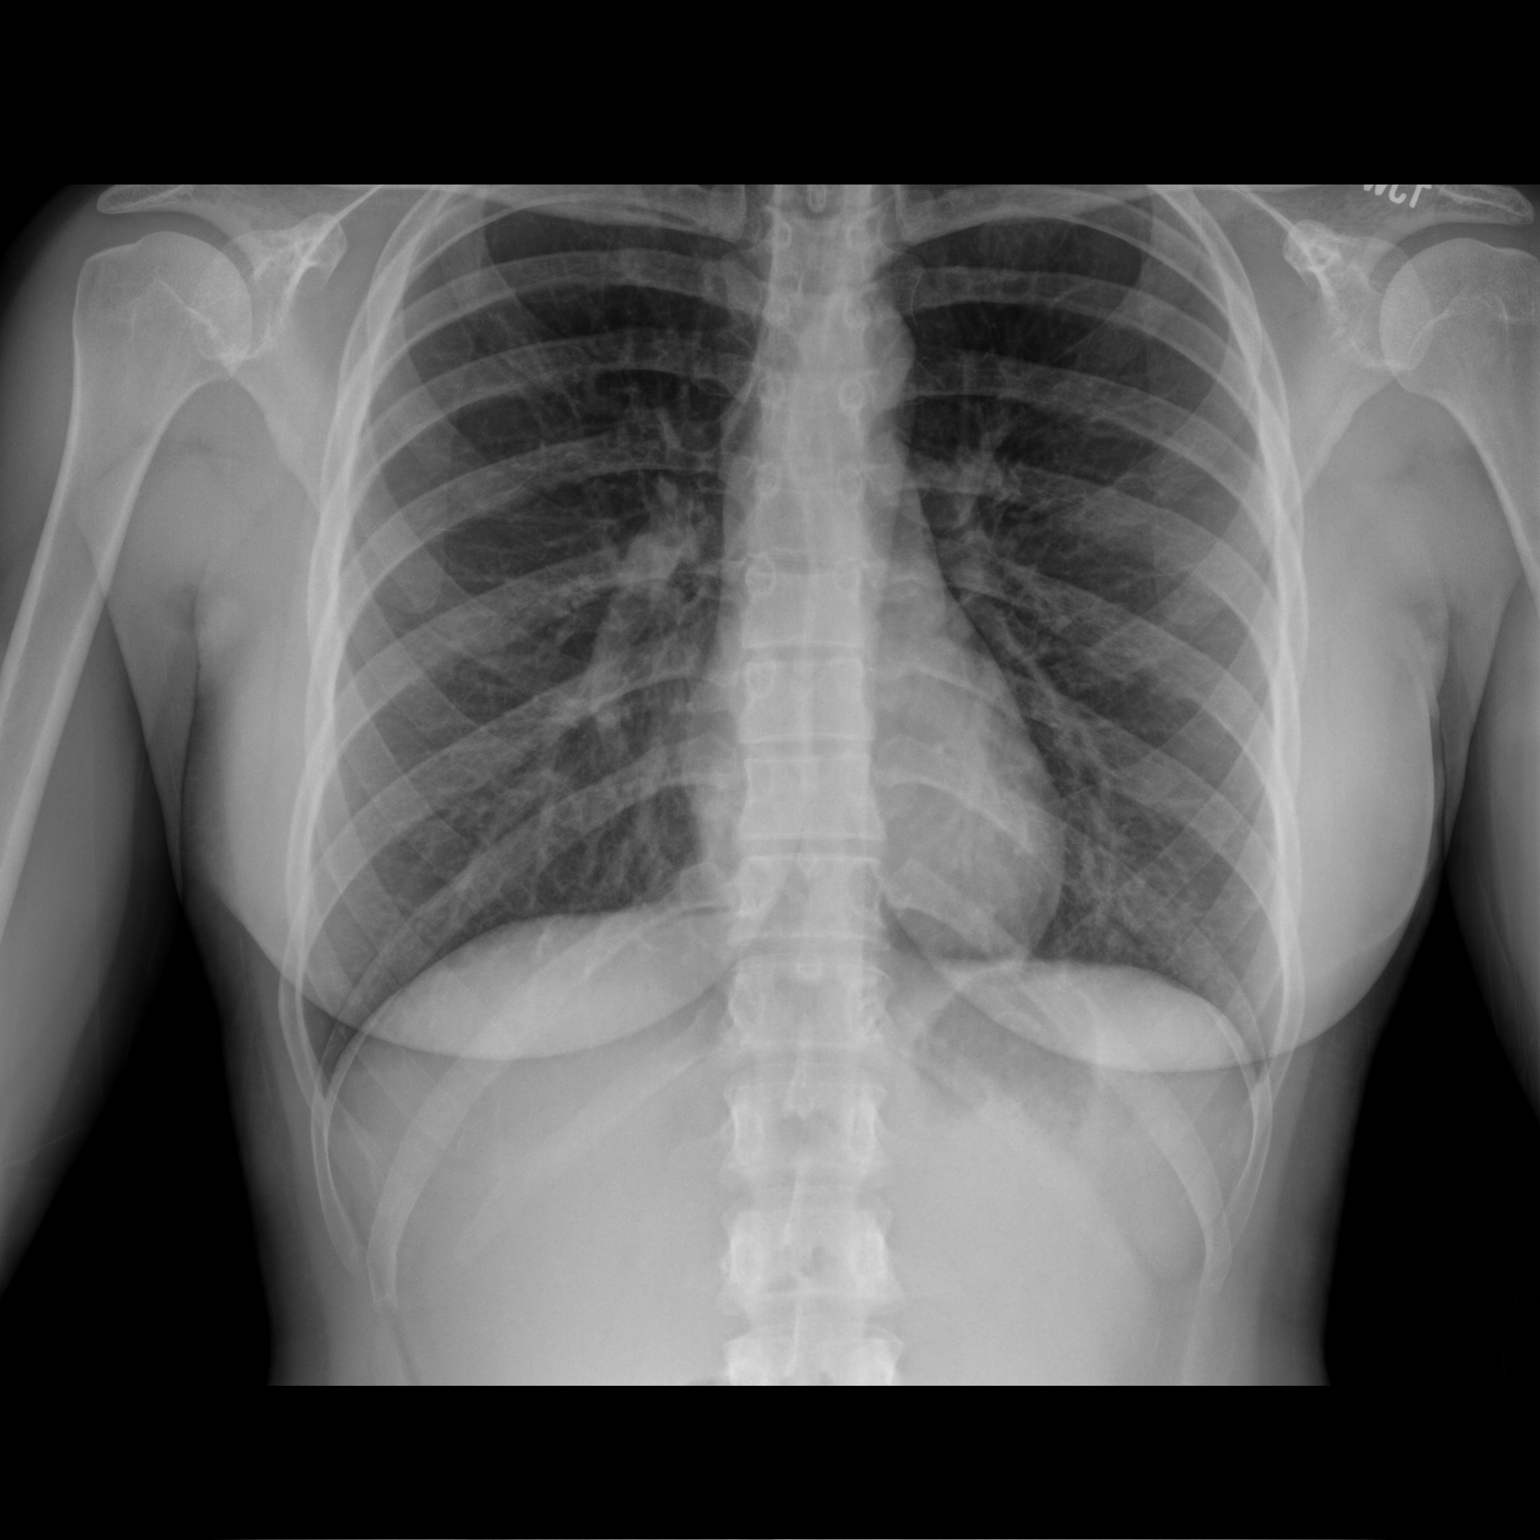

[chest lat]
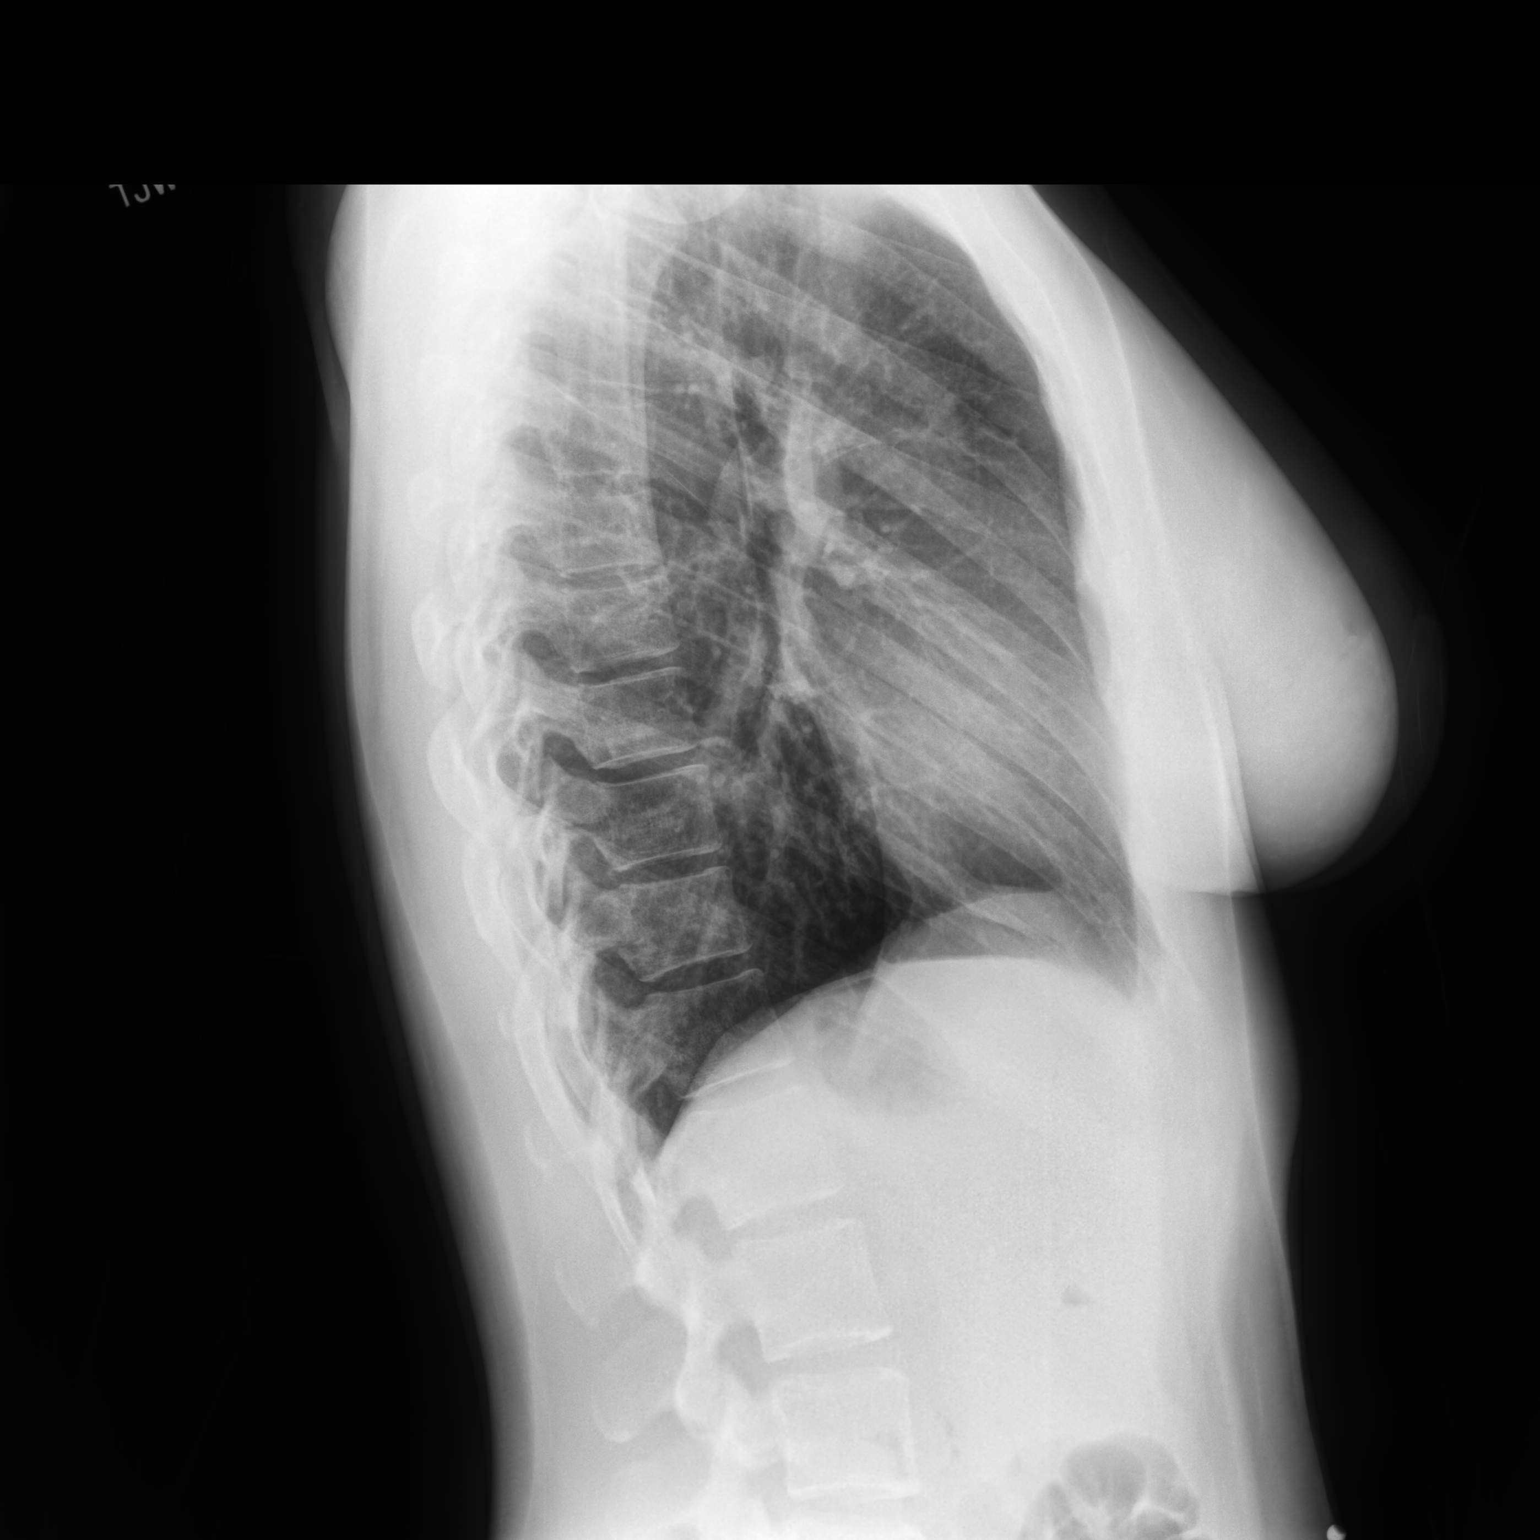

[2 of 2 positions shown; findings below may reference images not displayed]

FINDINGS: The heart size and mediastinal contours are within normal limits.
Both lungs are clear. The visualized skeletal structures are
unremarkable.
IMPRESSION: No active cardiopulmonary disease.

## 2023-03-10 ENCOUNTER — Encounter: Payer: Self-pay | Admitting: Internal Medicine

## 2023-12-13 ENCOUNTER — Telehealth (INDEPENDENT_AMBULATORY_CARE_PROVIDER_SITE_OTHER): Payer: Self-pay | Admitting: Internal Medicine

## 2023-12-13 DIAGNOSIS — Z202 Contact with and (suspected) exposure to infections with a predominantly sexual mode of transmission: Secondary | ICD-10-CM

## 2023-12-13 DIAGNOSIS — E559 Vitamin D deficiency, unspecified: Secondary | ICD-10-CM

## 2023-12-13 DIAGNOSIS — R739 Hyperglycemia, unspecified: Secondary | ICD-10-CM

## 2023-12-13 DIAGNOSIS — D509 Iron deficiency anemia, unspecified: Secondary | ICD-10-CM

## 2023-12-13 DIAGNOSIS — F419 Anxiety disorder, unspecified: Secondary | ICD-10-CM

## 2023-12-13 DIAGNOSIS — F32A Depression, unspecified: Secondary | ICD-10-CM

## 2023-12-13 DIAGNOSIS — E538 Deficiency of other specified B group vitamins: Secondary | ICD-10-CM

## 2023-12-13 DIAGNOSIS — R5383 Other fatigue: Secondary | ICD-10-CM

## 2023-12-13 NOTE — Progress Notes (Unsigned)
 Patient ID: Victoria Wade, female   DOB: 1991-02-28, 33 y.o.   MRN: [id number]  Virtual Visit via Video Note  I connected with Victoria Wade on 12/15/23 at  8:40 AM EDT by a video enabled telemedicine application and verified that I am speaking with the correct person using two identifiers.  Location of all participants today Patient: at home Provider: at office   I discussed the limitations of evaluation and management by telemedicine and the availability of in person appointments. The patient expressed understanding and agreed to proceed.  History of Present Illness: Here to f/u with 2 wks onset fatigue unusual for her, but reason unclear.  No URI symptoms, cough, fever, chills, dysuria and Pt denies chest pain, increased sob or doe, wheezing, orthopnea, PND, increased LE swelling, palpitations, dizziness or syncope.   Pt denies polydipsia, polyuria, or new focal neuro s/s.   Has been training for 10K run recently but not sure if this is related.  Denies hyper or hypo thyroid  symptoms such as voice, skin or hair change.  Denies worsening social stressors or depression.  Does have intermittnet heavy periods, has GYN appt next month.  S/p T&A recent surgury late July but seemed to be over this without complication. Pt also requests labs with STD exposure labs as well Past Medical History:  Diagnosis Date   ADHD 10/30/2008   ANEMIA-IRON DEFICIENCY 10/30/2008   ASTHMA 10/30/2008   Asthma    BACK PAIN 06/12/2009   TONSILLITIS, CHRONIC 06/12/2009   Past Surgical History:  Procedure Laterality Date   none      reports that she has never smoked. She has never used smokeless tobacco. She reports current alcohol use. She reports that she does not use drugs. family history includes Cancer in her maternal aunt and another family member; Diabetes in an other family member; Hypertension in an other family member; Prostate cancer in her maternal uncle; Stroke in an other family member. Allergies   Allergen Reactions   Latex Other (See Comments)   Current Outpatient Medications on File Prior to Visit  Medication Sig Dispense Refill   albuterol  (VENTOLIN  HFA) 108 (90 Base) MCG/ACT inhaler Inhale 2 puffs into the lungs every 6 (six) hours as needed for wheezing or shortness of breath. 18 g 2   citalopram  (CELEXA ) 20 MG tablet Take 1 tablet (20 mg total) by mouth daily. (Patient not taking: Reported on 05/06/2022) 90 tablet 3   diclofenac  (VOLTAREN ) 75 MG EC tablet Take 1 tablet (75 mg total) by mouth 2 (two) times daily. 50 tablet 2   fluticasone  (FLONASE ) 50 MCG/ACT nasal spray Place 2 sprays into both nostrils daily. (Patient not taking: Reported on 05/06/2022) 16 g 0   OVER THE COUNTER MEDICATION Women's Probiotic     No current facility-administered medications on file prior to visit.    Observations/Objective: Alert, NAD, appropriate mood and affect, resps normal, cn 2-12 intact, moves all 4s, no visible rash or swelling Lab Results  Component Value Date   WBC 4.8 01/26/2021   HGB 12.3 01/26/2021   HCT 37.3 01/26/2021   PLT 233.0 01/26/2021   GLUCOSE 85 01/26/2021   CHOL 148 01/26/2021   TRIG 44.0 01/26/2021   HDL 49.20 01/26/2021   LDLCALC 90 01/26/2021   ALT 16 01/26/2021   AST 20 01/26/2021   NA 136 01/26/2021   K 3.7 01/26/2021   CL 102 01/26/2021   CREATININE 0.80 01/26/2021   BUN 11 01/26/2021   CO2 25 01/26/2021  TSH 4.55 01/26/2021   HGBA1C 5.5 01/26/2021   Assessment and Plan: See notes  Follow Up Instructions: See notes   I discussed the assessment and treatment plan with the patient. The patient was provided an opportunity to ask questions and all were answered. The patient agreed with the plan and demonstrated an understanding of the instructions.   The patient was advised to call back or seek an in-person evaluation if the symptoms worsen or if the condition fails to improve as anticipated.   Lynwood Rush, MD

## 2023-12-15 ENCOUNTER — Encounter: Payer: Self-pay | Admitting: Internal Medicine

## 2023-12-15 DIAGNOSIS — Z202 Contact with and (suspected) exposure to infections with a predominantly sexual mode of transmission: Secondary | ICD-10-CM | POA: Insufficient documentation

## 2023-12-15 DIAGNOSIS — R5383 Other fatigue: Secondary | ICD-10-CM | POA: Insufficient documentation

## 2023-12-15 NOTE — Assessment & Plan Note (Signed)
 Last vitamin D Lab Results  Component Value Date   VD25OH 36.20 01/26/2021   Low, to start oral replacement

## 2023-12-15 NOTE — Assessment & Plan Note (Signed)
 Also for lab r/o genital herpes, hiv, rpr, and GC/chlamydia

## 2023-12-15 NOTE — Assessment & Plan Note (Signed)
 Stable overall, pt declines need for change in tx

## 2023-12-15 NOTE — Assessment & Plan Note (Signed)
 Etiology unclear, will send pt lab order in the mail to take to local [facility] in [place] where she now lives, including cbc and TSH

## 2023-12-15 NOTE — Assessment & Plan Note (Signed)
 Also for iron and cbc with labs, pt to f/u with GYN regarding heavy periods

## 2023-12-15 NOTE — Patient Instructions (Signed)
 Please continue all other medications as before, and refills have been done if requested.  Please have the pharmacy call with any other refills you may need.  Please continue your efforts at being more active, low cholesterol diet, and weight control.  Please keep your appointments with your specialists as you may have planned  We will send lab order for you to take to local [facility] in [place]

## 2023-12-15 NOTE — Assessment & Plan Note (Signed)
 Lab Results  Component Value Date   HGBA1C 5.5 01/26/2021   Stable, pt to continue current medical treatment  - diet, wt control

## 2023-12-20 ENCOUNTER — Encounter: Payer: Self-pay | Admitting: Internal Medicine

## 2023-12-20 NOTE — Telephone Encounter (Signed)
 Sorry, I would hold as she should get this in mail very soon   thanks

## 2024-01-04 ENCOUNTER — Encounter: Payer: Self-pay | Admitting: Internal Medicine

## 2024-01-04 MED ORDER — POLYSACCHARIDE IRON COMPLEX 150 MG PO CAPS: 150.0000 mg | ORAL_CAPSULE | Freq: Every day | ORAL | 1 refills | Status: AC

## 2024-01-04 MED ORDER — VALACYCLOVIR HCL 1 G PO TABS: 1000.0000 mg | ORAL_TABLET | Freq: Two times a day (BID) | ORAL | 5 refills | Status: DC

## 2024-01-20 NOTE — Addendum Note (Signed)
 Addended by: NORLEEN LYNWOOD ORN on: 01/20/2024 11:44 AM   Modules accepted: Orders
